# Patient Record
Sex: Female | Born: 1935 | Race: Black or African American | Hispanic: Refuse to answer
Health system: Midwestern US, Community
[De-identification: ages and names within clinical notes are randomized; demographics above are authoritative.]

## PROBLEM LIST (undated history)

## (undated) DIAGNOSIS — W19XXXA Unspecified fall, initial encounter: Secondary | ICD-10-CM

## (undated) DIAGNOSIS — Z111 Encounter for screening for respiratory tuberculosis: Secondary | ICD-10-CM

## (undated) DIAGNOSIS — K08109 Complete loss of teeth, unspecified cause, unspecified class: Secondary | ICD-10-CM

## (undated) DIAGNOSIS — F0390 Unspecified dementia without behavioral disturbance: Secondary | ICD-10-CM

## (undated) DIAGNOSIS — D890 Polyclonal hypergammaglobulinemia: Secondary | ICD-10-CM

## (undated) DIAGNOSIS — I10 Essential (primary) hypertension: Secondary | ICD-10-CM

## (undated) DIAGNOSIS — Z972 Presence of dental prosthetic device (complete) (partial): Secondary | ICD-10-CM

## (undated) DIAGNOSIS — R32 Unspecified urinary incontinence: Secondary | ICD-10-CM

## (undated) DIAGNOSIS — I639 Cerebral infarction, unspecified: Secondary | ICD-10-CM

## (undated) DIAGNOSIS — E785 Hyperlipidemia, unspecified: Secondary | ICD-10-CM

## (undated) HISTORY — PX: NO PAST SURGERIES: SHX2092

## (undated) HISTORY — DX: Hyperlipidemia, unspecified: E78.5

## (undated) HISTORY — DX: Complete loss of teeth, unspecified cause, unspecified class: K08.109

## (undated) HISTORY — DX: Presence of dental prosthetic device (complete) (partial): Z97.2

## (undated) HISTORY — DX: Unspecified dementia, unspecified severity, without behavioral disturbance, psychotic disturbance, mood disturbance, and anxiety: F03.90

## (undated) HISTORY — DX: Unspecified urinary incontinence: R32

## (undated) HISTORY — DX: Essential (primary) hypertension: I10

## (undated) HISTORY — DX: Cerebral infarction, unspecified: I63.9

## (undated) HISTORY — DX: Polyclonal hypergammaglobulinemia: D89.0

## (undated) HISTORY — PX: ABDOMINAL HYSTERECTOMY: SHX81

---

## 1986-10-08 DIAGNOSIS — I639 Cerebral infarction, unspecified: Secondary | ICD-10-CM

## 1986-10-08 HISTORY — DX: Cerebral infarction, unspecified: I63.9

## 2004-10-11 NOTE — ED Provider Notes (Signed)
[facility]                      EMERGENCY DEPARTMENT TREATMENT REPORT   ADMISSION   NAME:  Juanito Doom             PT. LOCATION:     ER  ERT2   MR #:         BILLING #: [id number]          DOS: 10/11/2004   TIME: 1:40 P   [birth year]   cc:   Primary Physician:   Dr. Trevor Iha   Time of Evaluation:  11:06 a.m.   CHIEF COMPLAINT:   Chills.   HISTORY OF PRESENT ILLNESS:  This 71 year old female was getting her hair   done at the beauty parlor this morning.  She had not been ill.  She had a   sudden onset of rigors.  She seemed to be somewhat confused.  Her speech   was slurred.  EMS was summoned and she was then brought to the emergency   department for evaluation.  At this time, complains of just being weak.   REVIEW OF SYSTEMS:   CONSTITUTIONAL:   Positive chills.   EYES: No visual symptoms.   ENT: No sore throat, runny nose or other URI symptoms.   ALLERGIC/IMMUNOLOGIC:  No urticaria or allergy symptoms.   RESPIRATORY:  No cough, shortness of breath, or wheezing.   CARDIOVASCULAR:  No chest pain, chest pressure, or palpitations.   GASTROINTESTINAL:  No vomiting, diarrhea, or abdominal pain.   GENITOURINARY:  The patient has been having some incontinence.  No dysuria.   MUSCULOSKELETAL:  No joint pain or swelling.   INTEGUMENTARY:  No rashes.   NEUROLOGICAL:   No headache.   PAST MEDICAL HISTORY:   Hypertension, hemorrhagic stroke.   FAMILY HISTORY:  Noncontributory.   SOCIAL HISTORY:  Here with family.   ALLERGIES:  None.   MEDICATIONS:   Atenolol, Klor-Con, Verapamil, Diovan.   PHYSICAL EXAMINATION:   GENERAL:   Very pleasant female.   VITAL SIGNS:  Blood pressure 167/84, pulse 96, respirations 24, temperature   100.9.  O2 saturation on room air is 94%.   HEENT:  Ears/Nose:  Hearing is grossly intact to voice.  Internal and   external examinations of the ears and nose are unremarkable.   Mouth/Throat:  Surfaces of the pharynx, palate, and tongue are pink, moist,    and without lesions.   NECK:  Supple, nontender, symmetrical, no masses or JVD, trachea midline,   thyroid not enlarged, nodular, or tender.   LYMPHATICS:  No cervical or submandibular lymphadenopathy palpated.   LUNGS:  There is some rhonchi right side.   GI:  Abdomen soft, nontender, without complaint of pain to palpation.  No   hepatomegaly or splenomegaly.   NEUROLOGICAL:   She is awake and alert, does move all extremities.   Strength is equal.  No focal deficits.   CONTINUATION BY DR. Frances Furbish:   I evaluated the patient with Mr. Malachi Paradise.  I interviewed and examined   the lady who is a 71 year old female who presents with altered mental   status and a fever, onset abruptly this morning.   Really no prodromal   symptoms in the emergency department.  We have pancultured her and treated   her fever and her mental status has improved.   IMPRESSION/MANAGEMENT PLAN:  Acute febrile illness, rule out sepsis.   DIAGNOSTIC TESTING:  A chest  x-ray per the radiologist showed mild   cardiomegaly without evidence of congestive heart failure.  CMP was totally   normal.  CBC showed a white cell count of 1400, hemoglobin 12.7, hematocrit   37.7, platelets are 126.  Differential is pending.  Urinalysis was nitrite   positive with pyuria and bacturia.   COURSE IN THE EMERGENCY DEPARTMENT:  As above, the patient was pancultured.   Tylenol was administered.   _____ sensorium improved.  Once the UTI was   identified, she was given Levaquin.  She improved.  I discussed the patient   with Dr. Celine Mans who is on for service medicine   today.  He is going to admit her to the hospital.  I am going to write some   courtesy admission orders.   Condition at this time is improved.   FINAL DIAGNOSIS:   Sepsis secondary to urinary tract infection.   Electronically Signed By:   Stormy Card, M.D. 10/13/2004 14:40   ____________________________   Stormy Card, M.D.   zga/zga  D:  10/11/2004  T:  10/11/2004  1:56 P   [id number]    Blanchard Mane, PA

## 2004-10-12 NOTE — Consults (Signed)
[facility]                               CONSULTATION REPORT                        CONSULTANT:  Antionette Poles, M.D.   NAME:       Michele Osborne   BILLING #:  [id number]             DATE OF CONSULT:     10/12/2004   MR #:       [id number]              ADM DATE:            10/11/2004   SS #        [id number]           PT. LOCATION:        [id number]   ATTENDING:  Georgeanna Harrison, M.D.   cc:    Georgeanna Harrison, M.D.          Antionette Poles, M.D.          Randell Loop, M.D.   REASON FOR CONSULTATION:   Evaluation of leukopenia.   CHIEF COMPLAINT:  Chills on the day of admission.   HISTORY OF PRESENT ILLNESS: Michele Osborne is a 71 year old pleasant   cooperative African-American female admitted to the emergency room with   acute onset of chills.  When this happened she was getting her hair done at   a beauty parlor.  She also noticed sudden onset of rigors.  She was also   found to be confused.  Her speech was slurred.  EMS was called and she was   evaluated by EMS and then brought into the emergency room for further   management of sudden onset rigors, chills, confusion, and weakness.   PAST MEDICAL/SURGICAL HISTORY:  The patient has a history of hypertension.   The patient had a stroke in 1978.  The patient also underwent hysterectomy   in the past.   PERSONAL HISTORY:  No smoking, no alcohol.  Divorced from her husband,   lives alone.  She has 3 grown up children.  Her mother is still alive, more   than 47 years old.   FAMILY HISTORY: No family history of any blood disorder.   SOCIAL HISTORY:  As mentioned above she is living by herself.   ALLERGIES:  No history of any allergy to any medications.   MEDICATIONS ON THE DAY OF ADMISSION:  The patient was taking Diovan,   verapamil, potassium supplements and atenolol.   REVIEW OF SYSTEMS:   CONSTITUTIONAL: Positive for chills.   EYES:  No visual disturbances.   ENT:  No sore throat.  No runny nose.    ALLERGY:  No rash or urticaria.   RESPIRATORY:  No shortness of breath.  No cough, hemoptysis or wheezing.   CARDIOVASCULAR:  No chest pain.  No tachycardia.  No palpitations.   GASTROINTESTINAL:  No abdominal symptoms like diarrhea, constipation or   pain.   GENITOURINARY:  The patient was having some incontinence temporarily.  No   history of dysuria.  No history of any blood in the urine.   MUSCULOSKELETAL:  No history of any muscle ache, swelling, or joint   swelling.   INTEGUMENTARY:  No skin rash.   NEUROLOGIC: No headaches.  No syncope but  feeling weak.   PHYSICAL FINDINGS ON ADMISSION:   VITAL SIGNS:  Blood pressure within normal limits, pulse normal,   temperature 100.9  at the emergency room .  Pulse oxygen showed 94%.   GENERAL:  Nutrition normal.   SKIN:  No nodule or petechiae.  No lymphadenopathy. No hepatosplenomegaly.   CHEST:  Heart sounds, lung sounds audible.   BREASTS:  No lumps.   EXTREMITIES: Homans sign negative.   CNS:  Examination normal.   PERTINENT HEMATOLOGIC LAB DATA:  The patient has normal chemistry except   albumin 3.2.  Renal profile and kidney profile normal.  Liver profile   normal.  WBC 1.4, with segmented neutrophils 69%, lymphocytes 30%,   eosinophils 1%.   Hemoglobin 12.7, hematocrit 37.7, indices normal except   RDW high at 16.1 and platelet count low at 126,000.  Urinalysis shows   bacteria.   HEMATOLOGY DISCUSSION:  This elderly lady admitted with signs and symptoms   of urosepsis presently with chills, rigors, and confusion has severe   neutropenia and moderate thrombocytopenia with a normal RBC count.   Most likely this pancytopenia that is leukopenia and thrombocytopenia most   likely due to bone marrow myeloid suppression from sepsis.  Other causes in   this age group may be underlying bone marrow disease like myelodysplastic   syndrome.   PLAN:  My plan is to;      1. Review the peripheral blood smear taken daily.      2. CBC.       3. Continue course of antibiotic to correct sepsis.  If there is no      marked improvement in her WBC count with antibiotic therapy consider a      bone marrow study at a later date.   Thank you again very much for asking me to see Ms. Turlington for evaluation   of leukopenia and thrombocytopenia.   Electronically Signed By:   Antionette Poles, M.D. 10/17/2004 13:01   _________________________________   Antionette Poles, M.D.   ndt  D:  10/12/2004  T:  10/12/2004  2:44 P   [id number]

## 2004-10-12 NOTE — H&P (Signed)
[facility]                              HISTORY AND PHYSICAL                             Georgeanna Harrison, M.D.   NAME:    Michele Osborne, Michele Osborne   MR #:    [id number]                    ADM DATE:        10/11/2004   BILLING  [id number]                   PT. LOCATION     [id number]   #:   SS #     [id number]   Georgeanna Harrison, M.D.   cc:    Georgeanna Harrison, M.D.   HISTORY OF PRESENT ILLNESS/HOSPITALIZATION:  [age]-year-old   African-American female was admitted to the emergency room from the beauty   parlor shop, with history of sudden onset of rigors, confused.  The patient   does not remember why she came here but she remembers that she was suddenly   having chills and rigors and then she was brought here.  In the emergency   room she was diagnosed as having urinary tract infection, possible sepsis,   and she was admitted for further management.   PAST MEDICAL HISTORY:  The patient denies history of diabetes, history of   hypertension, and history of possible hemorrhagic stroke or acute old   cerebrovascular accident in 17.   FAMILY HISTORY:  Unremarkable.   SOCIAL HISTORY:  No smoking, no alcohol.   ALLERGIES:  No known drug allergies.   REVIEW OF SYSTEMS:   CONSTITUTIONAL:  Positive for chills, no fever, but history of rigors.   EYES:  No history of any visual symptoms.   ENT:  No history of sore throat, runny nose or upper respiratory infection   symptoms.   ALLERGIC/IMMUNOLOGIC:  No urticarial rash.   RESPIRATORY:  No history of cough, expectoration or wheezing.   CARDIOVASCULAR:  No chest pain, no chest pressure or palpitation.   GASTROINTESTINAL:  No history of nausea, vomiting, diarrhea.   GENITOURINARY:  The patient herself denies any dysuria or polyuria but   complains about being somewhat incontinent.   PHYSICAL EXAMINATION:  On examination the patient is alert, awake,   oriented, quite pleasant female.    VITAL SIGNS:  Blood pressure 167/84, pulse 96 per minute, respirations 24   per minute, temperature 100.9, O2 saturations 94% at room air.   HEAD:  Atraumatic, normocephalic.  Eyes; Pupils equal, round, reactive to   light and accommodation bilaterally.   NECK:  No jugular venous distention, no thyromegaly.   CHEST:  Normal shape, bilateral air entry present, no rales, no rhonchi.   CARDIOVASCULAR SYSTEM:  S1, S2 regular.   ABDOMEN:  Benign, soft, bowel sounds present, no guarding, no rigidity.   Stool rectal negative for occult blood.   EXTREMITIES:  No edema.   LABORATORY:  Sodium 139, potassium 3.8, chloride 107, bicarbonate 24,   glucose 104, BUN 16, creatinine 0.8, SGOT 40, SGPT 43, alkaline phosphatase   125, calcium 8.6, total protein 7.9, albumin 3.2, WBC 1.4, hemoglobin 12.7,   hematocrit 37.7.  Urine cloudy with very positive infection, 3+ bacteria.   Gram  negative colonies in the urine.   IMPRESSION:      1. Sepsis.      2. Leukopenia.      3. Urinary tract infection.      4. Rigors.      5. Hypertension.      6. History of old cerebrovascular accident.   PLAN:      1. Rigors most likely secondary to urinary tract infection colony      counted only 20,000 at this point but will treat her with Levaquin,      continue follow up on her culture and sensitivity results.      2. Hypertension, would continue her medications and follow up the blood      pressure.      3. Leukopenia, I would repeat CBC and follow up on leukopenia.  The      cause of leukopenia could be sepsis overwhelming or some other      pathological condition underlying.  Medication wise I do not see any      medication which is responsible for her leukopenia at this point.      4. Will consult hematologic consultation for the same.   Electronically Signed By:   Georgeanna Harrison, M.D. 10/13/2004 09:45   ____________________________   Georgeanna Harrison, M.D.   [email or web address]  D:  10/12/2004  T:  10/12/2004  9:31 A   [id number]

## 2004-10-15 NOTE — Discharge Summary (Signed)
[facility]                                DISCHARGE SUMMARY   Michele Osborne, EIMER   E:   MR  [id number]                          ADM DATE:      10/11/2004   #:   Michele Osborne  [id number]                       DIS DATE:      10/15/2004   #   Georgeanna Harrison, M.D.   cc:    Georgeanna Harrison, M.D.   HISTORY OF PRESENT ILLNESS:  The patient is a 71 year old African-American   female with the past medical history of hypertension, admitted with a   history of sudden onset of fever, chills and shakes and also, altered   mental status without any focal deficit.   HOSPITAL COURSE:  The patient was admitted.  Her urine showed a urinary   tract infection E. coli, sensitive to Levaquin.  The patient was started on   Levaquin.  Now, today she is afebrile.   During her stay she was also found that she has leukopenia and WBC count   was 1.7.  Dr. Glean Hess, a Hematologic consultation was called in.  Further   testing did not show any hepatosplenomegaly or any pathology suggestive of   primary cause of leukopenia, but her leukopenia improved after IV   antibiotics, suggestive of sepsis related leukopenia.   DISCHARGE DIAGNOSES:   1.     Urinary tract sepsis.   2.     Urinary tract infection.   3.     Leukopenia.   4.     Hypertension.   DISCHARGE AND DISCHARGE INSTRUCTIONS:  The patient is being discharged   home.  The patient will be followed up with her primary care doctor in one   week.   DIET:  Low salt, low cholesterol diet.   ACTIVITY:  As tolerated.   DISCHARGE MEDICATIONS:   1.     She will continue her medications, which includes Atenolol 35 mg      p.o. once a day.   2.     Diovan 160/12.5 mg p.o. once a day.   3.     Verapamil 240 mg p.o. once a day.   4.     Levaquin 250 mg p.o. once a day for the next seven days.   Electronically Signed By:   Georgeanna Harrison, M.D. 10/17/2004 08:20   ________________________________   Georgeanna Harrison, M.D.    [email or web address]  D:  10/15/2004  T:  10/16/2004 11:33 A   [id number]

## 2008-10-21 LAB — METABOLIC PANEL, COMPREHENSIVE
A-G Ratio: 0.7 — ABNORMAL LOW (ref 0.8–1.7)
ALT (SGPT): 29 U/L — ABNORMAL LOW (ref 30–65)
AST (SGOT): 24 U/L (ref 15–37)
Albumin: 3.7 g/dL (ref 3.4–5.0)
Alk. phosphatase: 99 U/L (ref 50–136)
Anion gap: 7 mmol/L (ref 5–15)
BUN/Creatinine ratio: 19 (ref 12–20)
BUN: 15 MG/DL (ref 7–18)
Bilirubin, total: 0.3 MG/DL (ref 0.1–0.9)
CO2: 27 MMOL/L (ref 21–32)
Calcium: 9.1 MG/DL (ref 8.4–10.4)
Chloride: 104 MMOL/L (ref 100–108)
Creatinine: 0.8 MG/DL (ref 0.6–1.3)
GFR est AA: >60 mL/min/{1.73_m2} (ref >60)
GFR est non-AA: >60 mL/min/{1.73_m2} (ref >60)
Globulin: 5.3 g/dL — ABNORMAL HIGH (ref 2.0–4.0)
Glucose: 102 MG/DL — ABNORMAL HIGH (ref 74–99)
Potassium: 3.7 MMOL/L (ref 3.5–5.5)
Protein, total: 9 g/dL — ABNORMAL HIGH (ref 6.4–8.2)
Sodium: 138 MMOL/L (ref 136–145)

## 2008-10-23 LAB — PROTEIN ELECTROPHORESIS
Albumin: 3.53 g/dL — ABNORMAL LOW (ref 3.75–5.01)
Alpha-1: 0.35 g/dL (ref 0.19–0.46)
Alpha-2: 0.96 g/dL (ref 0.48–1.05)
Beta: 1.35 g/dL — ABNORMAL HIGH (ref 0.48–1.10)
Gamma: 2.4 g/dL — ABNORMAL HIGH (ref 0.62–1.51)
Total protein: 8.6 g/dL — ABNORMAL HIGH (ref 6.00–8.30)

## 2008-10-23 LAB — PROTEIN ELECTROPHORESIS + IFE, UR, 24HR
Albumin,urine: DETECTED
Alpha-1 Globulins,urine: DETECTED
Alpha-2 Globulins,urine: DETECTED
Beta globulins,urine: DETECTED
FREE K/L RATIO: 5.2 ratio (ref 2.04–10.37)
FREE KAPPA LT CHAINS: 8.32 mg/dL — ABNORMAL HIGH (ref 0.14–2.42)
FREE LAMBDA LT CHAINS: 1.6 mg/dL — ABNORMAL HIGH (ref 0.02–0.67)
Gamma globulins,urine: DETECTED

## 2010-03-10 LAB — HM DEXA SCAN

## 2014-03-22 LAB — HM MAMMOGRAPHY

## 2014-06-10 ENCOUNTER — Ambulatory Visit (INDEPENDENT_AMBULATORY_CARE_PROVIDER_SITE_OTHER): Payer: Medicare Other | Admitting: Medical

## 2014-06-10 ENCOUNTER — Telehealth: Payer: Self-pay | Admitting: Medical

## 2014-06-10 ENCOUNTER — Encounter: Payer: Self-pay | Admitting: Medical

## 2014-06-10 VITALS — BP 130/80 | HR 100 | Temp 98.5°F | Resp 16 | Ht 60.0 in | Wt 108.0 lb

## 2014-06-10 DIAGNOSIS — F03918 Unspecified dementia, unspecified severity, with other behavioral disturbance: Secondary | ICD-10-CM

## 2014-06-10 DIAGNOSIS — Z87440 Personal history of urinary (tract) infections: Secondary | ICD-10-CM

## 2014-06-10 DIAGNOSIS — L608 Other nail disorders: Secondary | ICD-10-CM

## 2014-06-10 DIAGNOSIS — R69 Illness, unspecified: Secondary | ICD-10-CM

## 2014-06-10 DIAGNOSIS — I10 Essential (primary) hypertension: Secondary | ICD-10-CM

## 2014-06-10 DIAGNOSIS — Z8673 Personal history of transient ischemic attack (TIA), and cerebral infarction without residual deficits: Secondary | ICD-10-CM

## 2014-06-10 DIAGNOSIS — L602 Onychogryphosis: Secondary | ICD-10-CM

## 2014-06-10 DIAGNOSIS — Z9181 History of falling: Secondary | ICD-10-CM

## 2014-06-10 DIAGNOSIS — E785 Hyperlipidemia, unspecified: Secondary | ICD-10-CM

## 2014-06-10 DIAGNOSIS — Z7409 Other reduced mobility: Secondary | ICD-10-CM

## 2014-06-10 DIAGNOSIS — N3946 Mixed incontinence: Secondary | ICD-10-CM

## 2014-06-10 DIAGNOSIS — Q849 Congenital malformation of integument, unspecified: Secondary | ICD-10-CM

## 2014-06-10 DIAGNOSIS — F0391 Unspecified dementia with behavioral disturbance: Secondary | ICD-10-CM

## 2014-06-10 DIAGNOSIS — Q846 Other congenital malformations of nails: Secondary | ICD-10-CM

## 2014-06-10 LAB — COMPREHENSIVE METABOLIC PANEL
ALBUMIN: 3.8 g/dL (ref 3.5–5.2)
ALK PHOS: 76 U/L (ref 39–117)
ALT: 9 U/L (ref 0–35)
AST: 17 U/L (ref 0–37)
BUN: 20 mg/dL (ref 6–23)
CO2: 27 meq/L (ref 19–32)
Calcium: 9.6 mg/dL (ref 8.4–10.5)
Chloride: 105 mEq/L (ref 96–112)
Creat: 0.92 mg/dL (ref 0.50–1.10)
GLUCOSE: 92 mg/dL (ref 70–99)
Potassium: 3.7 mEq/L (ref 3.5–5.3)
SODIUM: 141 meq/L (ref 135–145)
TOTAL PROTEIN: 8.8 g/dL — AB (ref 6.0–8.3)
Total Bilirubin: 0.3 mg/dL (ref 0.2–1.2)

## 2014-06-10 LAB — POCT URINALYSIS DIPSTICK
Bilirubin, UA: NEGATIVE
Glucose, UA: NEGATIVE
Ketones, UA: NEGATIVE
Nitrite, UA: POSITIVE
RBC UA: POSITIVE
SPEC GRAV UA: 1.01
UROBILINOGEN UA: NEGATIVE
pH, UA: 5

## 2014-06-10 LAB — CBC
HCT: 37 % (ref 36.0–46.0)
Hemoglobin: 12.6 g/dL (ref 12.0–15.0)
MCH: 27.8 pg (ref 26.0–34.0)
MCHC: 34.1 g/dL (ref 30.0–36.0)
MCV: 81.5 fL (ref 78.0–100.0)
Platelets: 181 10*3/uL (ref 150–400)
RBC: 4.54 MIL/uL (ref 3.87–5.11)
RDW: 17.1 % — ABNORMAL HIGH (ref 11.5–15.5)
WBC: 6.3 10*3/uL (ref 4.0–10.5)

## 2014-06-10 LAB — TSH: TSH: 3.992 u[IU]/mL (ref 0.350–4.500)

## 2014-06-10 NOTE — Telephone Encounter (Signed)
pls refer to [facility] for dementia, and call out a walker to medical supply or home health.

## 2014-06-10 NOTE — Progress Notes (Signed)
**Note Kathleen-Identified via Obfuscation** Subjective:   Kathleen Chung is a 80 y.o. female presenting on 06/10/2014 with to get established and have forms filled out  Here as a new patient today.   Here with both her daughters, Kathleen Chung and Kathleen Chung (patient of mine)  She was living alone in [place] [place] had her own apartment.  But she was having worsening problems managing her day to day functions.  Just moved in with daughter here in [place] yesterday.     Daughter moved her in to take better care of her, keep better watch on her.  Her dementia has worsened more in last few months.   Having to hold onto things to avoid falling.  Having mood swings.  Waking up in the morning, she doesn't know where she is at times.  Not eating 3 meals daily, sometimes has to be prompted to eat.  daughter wants to use in house nurse, [facility] for adult day care, and daughter's own care for day to day functioning.   She has to be prompted to go to the toilet.  Wears pads.  Has hx/o frequent UTI.   daughter is bathing her.  She does not cook her own foods.  Was seeing neurology back in [place].   She had massive stroke back in the 33s.  Hasn't been seeing psychiatry.  daughter has forms for [facility] medical form as well as FMLA for her to help take care of mother.  Daughter has concerns about her feet.  Left ring fingernail looks like it fell off.   She is on medication for hypertension, hyperlipidemia, an overactive bladder.  Her last labs in primary care visit was January of this year back in [place].     No other complaint.  Review of Systems ROS as in subjective      Objective:   Filed Vitals:   06/10/14 1021  BP: 130/80  Pulse: 100  Temp: 98.5 F (36.9 C)  Resp: 16   BP 130/80  Pulse 100  Temp(Src) 98.5 F (36.9 C) (Oral)  Resp 16  Ht 5' (1.524 m)  Wt 108 lb (48.988 kg)  BMI 21.09 kg/m2   General appearance: alert, no distress, WD/WN, pleasant lean AA female HEENT:  normocephalic, sclerae anicteric, TMs pearly, nares patent, no discharge or erythema, pharynx normal Oral cavity: MMM, no lesions, full dentures present Neck: supple, no lymphadenopathy, no thyromegaly, no masses Heart: RRR, normal S1, S2, no murmurs Lungs: CTA bilaterally, no wheezes, rhonchi, or rales Abdomen: +bs, soft, non tender, non distended, no masses, no hepatomegaly, no splenomegaly Pulses: 2+ symmetric, upper and lower extremities, normal cap refill Ext: no edema, no cyanosis or clubbing Skin: dry skin of feet, thickened toenails throughout, bunions present bilat great toes, left ring finger/4th finger nail with deformity from likley crush injury in the past Neuro: Cn2-12 intact, DTRs normal, normal strength and sensation, but she was talking about past events/past people in a present tense in general conversation suggesting dementia.  Pleasant, does answer my questions, but seems to downplay any concerns.  MMSE:17/30 score today       Assessment: Encounter Diagnoses  Name Primary?  . Dementia, with behavioral disturbance Yes  . History of stroke   . Essential hypertension, benign   . Hyperlipidemia   . Mixed incontinence   . Thickened nails   . Nail anomaly   . Impaired mobility and ADLs   . Risk for falls   . History of urinary tract infection  Plan: Dementia, history of stroke-MMSE today with score of 17 for baseline.  PHQ-9 score today of 16.   Mood disorder questionnaire negative.  Completed form for adult day care, completed FMLA form for daughter.  We will request prior records, referral to neurology here.  Hypertension-continue current medication, labs today  Hyperlipidemia-daughter's request that she not take this medication if not needed. We will defer this to next visit  Mixed incontinence, history of urinary tract infection-urinalysis today abnormal.  Urine culture sent.  Nail anomaly - we can pursue podiatry referral at a later visit  Impaired  mobility and ADLs, risk for falls - discussed the need for continuous supervision which it sounds like her daughter is working on.  Daughter will be taking care of her, she will pursue adult day care for activities during the day with supervision, and a home health nurse will be coming in to help in the home as well. She certainly has advanced dementia to the point she does not need to be trusted alone, with cooking/stove, with driving or managing her financial affairs.  Spent approximately 1 hour in face-to-face evaluation, reviewing records, completing forms, setting at referrals, and coming up with plan of care.  Handout given and discussed.Britta Mccreedy was seen today for to get established and have forms filled out.  Diagnoses and associated orders for this visit:  Dementia, with behavioral disturbance - Comprehensive metabolic panel - CBC - TSH - RPR  History of stroke  Essential hypertension, benign - Comprehensive metabolic panel - CBC - TSH  Hyperlipidemia  Mixed incontinence - POCT urinalysis dipstick  Thickened nails  Nail anomaly  Impaired mobility and ADLs  Risk for falls  History of urinary tract infection     Return pending labs.

## 2014-06-10 NOTE — Addendum Note (Signed)
Addended by: Jac Canavan on: 06/10/2014 09:14 PM   Modules accepted: Level of Service

## 2014-06-10 NOTE — Patient Instructions (Signed)
Thank you for giving me the opportunity to serve you today.    Your diagnosis today includes: Encounter Diagnoses  Name Primary?  . Dementia, with behavioral disturbance Yes  . History of stroke   . Essential hypertension, benign   . Hyperlipidemia   . Mixed incontinence   . Thickened nails   . Nail anomaly   . Impaired mobility and ADLs   . Risk for falls   . History of urinary tract infection      Specific recommendations today include:  I completed the adult day care form today.  We will refer her to neurology here to [facility] care  We will hold off on podiatry referral for now unless you want to call and get her appoitnement in general  Please establish her with and eye doctor  We will call with lab results  I do recommend the things you discussed such as 100% supervision either by daughter, home health nurse during the day or adult day care  Given her dementia, she does not seem to be able to be trusted with cooking or using stoves or space heaters on her own, she does need help with bathing, activities of daily living, she should not be driving, she should not be handling her financial affairs.  I do think it is a good at it to have adult day care enrichment activities for her  She is a fall risk and make sure the home is safe and without things that can cause her to fall such as loose rugs, loose carpets, clutter  I will work on the other FMLA and return this   Return pending labs.    I have included other useful information below for your review.   Fall Prevention and Home Safety Falls cause injuries and can affect all age groups. It is possible to use preventive measures to significantly decrease the likelihood of falls. There are many simple measures which can make your home safer and prevent falls. OUTDOORS  Repair cracks and edges of walkways and driveways.  Remove high doorway thresholds.  Trim shrubbery on the main path into your home.  Have  good outside lighting.  Clear walkways of tools, rocks, debris, and clutter.  Check that handrails are not broken and are securely fastened. Both sides of steps should have handrails.  Have leaves, snow, and ice cleared regularly.  Use sand or salt on walkways during winter months.  In the garage, clean up grease or oil spills. BATHROOM  Install night lights.  Install grab bars by the toilet and in the tub and shower.  Use non-skid mats or decals in the tub or shower.  Place a plastic non-slip stool in the shower to sit on, if needed.  Keep floors dry and clean up all water on the floor immediately.  Remove soap buildup in the tub or shower on a regular basis.  Secure bath mats with non-slip, double-sided rug tape.  Remove throw rugs and tripping hazards from the floors. BEDROOMS  Install night lights.  Make sure a bedside light is easy to reach.  Do not use oversized bedding.  Keep a telephone by your bedside.  Have a firm chair with side arms to use for getting dressed.  Remove throw rugs and tripping hazards from the floor. KITCHEN  Keep handles on pots and pans turned toward the center of the stove. Use back burners when possible.  Clean up spills quickly and allow time for drying.  Avoid walking on wet  floors.  Avoid hot utensils and knives.  Position shelves so they are not too high or low.  Place commonly used objects within easy reach.  If necessary, use a sturdy step stool with a grab bar when reaching.  Keep electrical cables out of the way.  Do not use floor polish or wax that makes floors slippery. If you must use wax, use non-skid floor wax.  Remove throw rugs and tripping hazards from the floor. STAIRWAYS  Never leave objects on stairs.  Place handrails on both sides of stairways and use them. Fix any loose handrails. Make sure handrails on both sides of the stairways are as long as the stairs.  Check carpeting to make sure it is  firmly attached along stairs. Make repairs to worn or loose carpet promptly.  Avoid placing throw rugs at the top or bottom of stairways, or properly secure the rug with carpet tape to prevent slippage. Get rid of throw rugs, if possible.  Have an electrician put in a light switch at the top and bottom of the stairs. OTHER FALL PREVENTION TIPS  Wear low-heel or rubber-soled shoes that are supportive and fit well. Wear closed toe shoes.  When using a stepladder, make sure it is fully opened and both spreaders are firmly locked. Do not climb a closed stepladder.  Add color or contrast paint or tape to grab bars and handrails in your home. Place contrasting color strips on first and last steps.  Learn and use mobility aids as needed. Install an electrical emergency response system.  Turn on lights to avoid dark areas. Replace light bulbs that burn out immediately. Get light switches that glow.  Arrange furniture to create clear pathways. Keep furniture in the same place.  Firmly attach carpet with non-skid or double-sided tape.  Eliminate uneven floor surfaces.  Select a carpet pattern that does not visually hide the edge of steps.  Be aware of all pets. OTHER HOME SAFETY TIPS  Set the water temperature for 120 F (48.8 C).  Keep emergency numbers on or near the telephone.  Keep smoke detectors on every level of the home and near sleeping areas. Document Released: 09/14/2002 Document Revised: 03/25/2012 Document Reviewed: 12/14/2011 [facility] Patient Information 2015 [place], [place]. This information is not intended to replace advice given to you by your health care provider. Make sure you discuss any questions you have with your health care provider.   Dementia Dementia is a general term for problems with brain function. A person with dementia has memory loss and a hard time with at least one other brain function such as thinking, speaking, or problem solving. Dementia can affect  social functioning, how you do your job, your mood, or your personality. The changes may be hidden for a long time. The earliest forms of this disease are usually not detected by family or friends. Dementia can be:  Irreversible.  Potentially reversible.  Partially reversible.  Progressive. This means it can get worse over time. CAUSES  Irreversible dementia causes may include:  Degeneration of brain cells (Alzheimer disease or Lewy body dementia).  Multiple small strokes (vascular dementia).  Infection (chronic meningitis or Creutzfeldt-Jakob disease).  Frontotemporal dementia. This affects younger people, age 60 to 45, compared to those who have Alzheimer disease.  Dementia associated with other disorders like Parkinson disease, Huntington disease, or HIV-associated dementia. Potentially or partially reversible dementia causes may include:  Medicines.  Metabolic causes such as excessive alcohol intake, vitamin B12 deficiency, or thyroid disease.  Masses or pressure in the brain such as a tumor, blood clot, or hydrocephalus. SIGNS AND SYMPTOMS  Symptoms are often hard to detect. Family members or coworkers may not notice them early in the disease process. Different people with dementia may have different symptoms. Symptoms can include:  A hard time with memory, especially recent memory. Long-term memory may not be impaired.  Asking the same question multiple times or forgetting something someone just said.  A hard time speaking your thoughts or finding certain words.  A hard time solving problems or performing familiar tasks (such as how to use a telephone).  Sudden changes in mood.  Changes in personality, especially increasing moodiness or mistrust.  Depression.  A hard time understanding complex ideas that were never a problem in the past. DIAGNOSIS  There are no specific tests for dementia.   Your health care provider may recommend a thorough evaluation. This is  because some forms of dementia can be reversible. The evaluation will likely include a physical exam and getting a detailed history from you and a family member. The history often gives the best clues and suggestions for a diagnosis.  Memory testing may be done. A detailed brain function evaluation called neuropsychologic testing may be helpful.  Lab tests and brain imaging (such as a CT scan or MRI scan) are sometimes important.  Sometimes observation and re-evaluation over time is very helpful. TREATMENT  Treatment depends on the cause.   If the problem is a vitamin deficiency, it may be helped or cured with supplements.  For dementias such as Alzheimer disease, medicines are available to stabilize or slow the course of the disease. There are no cures for this type of dementia.  Your health care provider can help direct you to groups, organizations, and other health care providers to help with decisions in the care of you or your loved one. HOME CARE INSTRUCTIONS The care of individuals with dementia is varied and dependent upon the progression of the dementia. The following suggestions are intended for the person living with, or caring for, the person with dementia.  Create a safe environment.  Remove the locks on bathroom doors to prevent the person from accidentally locking himself or herself in.  Use childproof latches on kitchen cabinets and any place where cleaning supplies, chemicals, or alcohol are kept.  Use childproof covers in unused electrical outlets.  Install childproof devices to keep doors and windows secured.  Remove stove knobs or install safety knobs and an automatic shut-off on the stove.  Lower the temperature on water heaters.  Label medicines and keep them locked up.  Secure knives, lighters, matches, power tools, and guns, and keep these items out of reach.  Keep the house free from clutter. Remove rugs or anything that might contribute to a  fall.  Remove objects that might break and hurt the person.  Make sure lighting is good, both inside and outside.  Install grab rails as needed.  Use a monitoring device to alert you to falls or other needs for help.  Reduce confusion.  Keep familiar objects and people around.  Use night lights or dim lights at night.  Label items or areas.  Use reminders, notes, or directions for daily activities or tasks.  Keep a simple, consistent routine for waking, meals, bathing, dressing, and bedtime.  Create a calm, quiet environment.  Place large clocks and calendars prominently.  Display emergency numbers and home address near all telephones.  Use cues to establish different  times of the day. An example is to open curtains to let the natural light in during the day.   Use effective communication.  Choose simple words and short sentences.  Use a gentle, calm tone of voice.  Be careful not to interrupt.  If the person is struggling to find a word or communicate a thought, try to provide the word or thought.  Ask one question at a time. Allow the person ample time to answer questions. Repeat the question again if the person does not respond.  Reduce nighttime restlessness.  Provide a comfortable bed.  Have a consistent nighttime routine.  Ensure a regular walking or physical activity schedule. Involve the person in daily activities as much as possible.  Limit napping during the day.  Limit caffeine.  Attend social events that stimulate rather than overwhelm the senses.  Encourage good nutrition and hydration.  Reduce distractions during meal times and snacks.  Avoid foods that are too hot or too cold.  Monitor chewing and swallowing ability.  Continue with routine vision, hearing, dental, and medical screenings.  Give medicines only as directed by the health care provider.  Monitor driving abilities. Do not allow the person to drive when safe driving is no  longer possible.  Register with an identification program which could provide location assistance in the event of a missing person situation. SEEK MEDICAL CARE IF:   New behavioral problems start such as moodiness, aggressiveness, or seeing things that are not there (hallucinations).  Any new problem with brain function happens. This includes problems with balance, speech, or falling a lot.  Problems with swallowing develop.  Any symptoms of other illness happen. Small changes or worsening in any aspect of brain function can be a sign that the illness is getting worse. It can also be a sign of another medical illness such as infection. Seeing a health care provider right away is important. SEEK IMMEDIATE MEDICAL CARE IF:   A fever develops.  New or worsened confusion develops.  New or worsened sleepiness develops.  Staying awake becomes hard to do. Document Released: 03/20/2001 Document Revised: 02/08/2014 Document Reviewed: 02/19/2011 [facility] Patient Information 2015 [place], [place]. This information is not intended to replace advice given to you by your health care provider. Make sure you discuss any questions you have with your health care provider.

## 2014-06-11 ENCOUNTER — Other Ambulatory Visit: Payer: Self-pay | Admitting: Medical

## 2014-06-11 LAB — RPR

## 2014-06-11 MED ORDER — CEPHALEXIN 500 MG PO CAPS: 500.0000 mg | ORAL_CAPSULE | Freq: Three times a day (TID) | ORAL | Status: DC

## 2014-06-13 LAB — URINE CULTURE: Colony Count: >=100000

## 2014-06-15 ENCOUNTER — Telehealth: Payer: Self-pay | Admitting: Family Medicine

## 2014-06-15 NOTE — Telephone Encounter (Signed)
Unable to reach by phone

## 2014-06-15 NOTE — Telephone Encounter (Signed)
Message copied by Janeice Robinson on Tue Jun 15, 2014  2:35 PM ------      Message from: Jac Canavan      Created: Thu Jun 10, 2014  7:48 PM       Pls call back daughter De Blanch.  Regarding the FMLA, the most I can say on the form is patient may miss 8 hours per 1 day per month to help with mother's care.   She had noted 4 days per week.  I can't do anything other than 1 day per month on the FMLA.  She should talk with her HR at her employer regarding mom's condition, and if needed consider other arrangements or reduction in work hours if she is going to be that involved in mother's care.              Let me know if agreeable to putting 1 day per month on the form. ------

## 2014-06-15 NOTE — Telephone Encounter (Signed)
I spoke with Kathleen Chung and I went over your message in detail and she understood. She states that she will pick up those forms that you signed for being out 8 hours per 1 month. CLS

## 2014-06-15 NOTE — Telephone Encounter (Signed)
No answer. CLS

## 2014-06-16 NOTE — Telephone Encounter (Signed)
Form completed for daughter De Blanch.  Charge the usual fee.  Make copies and return FMLA to patient, make sure we scan this under the patient [facility] file.

## 2014-06-17 DIAGNOSIS — Z029 Encounter for administrative examinations, unspecified: Secondary | ICD-10-CM

## 2014-06-17 NOTE — Telephone Encounter (Signed)
Spoke with daughter about walker. Advised her we would call when we hear about appointment.

## 2014-06-17 NOTE — Telephone Encounter (Signed)
Sent fax to [facility] for appointment.

## 2014-06-17 NOTE — Telephone Encounter (Signed)
LM to [facility] 

## 2014-06-18 ENCOUNTER — Telehealth: Payer: Self-pay | Admitting: Medical

## 2014-06-18 NOTE — Telephone Encounter (Signed)
Additional records received from pt neuro doctor. Records sent to back for review.

## 2014-06-24 NOTE — Telephone Encounter (Signed)
Unable to contact patient by phone number provided. Spoke with daughter, Meriam Sprague and she doesn't know if her mom has seen neurologist or not. There is a note in the system that [place] received records from her Neuro doctor, and sent them back for review.

## 2014-06-24 NOTE — Telephone Encounter (Signed)
pls check on this, still open in my box

## 2014-06-24 NOTE — Telephone Encounter (Signed)
Records are ready to be scanned.  I have reviewed.  Why has it been so difficult to get in touch with her daughter>?  We referred to neuro, and I asked for 1 mo follow up.  She lives with one.  I did the FMLA for the other daughter.  Please make sure we get updated contact info on the daughters.  One of the daughters is my patient.

## 2014-06-25 ENCOUNTER — Telehealth: Payer: Self-pay | Admitting: Medical

## 2014-06-25 NOTE — Telephone Encounter (Signed)
Received records from pt's former primary care. Sending back for review.

## 2014-06-25 NOTE — Telephone Encounter (Signed)
Left message at home/Vicky who is the daughter who lives with our patient.  Also left message with emergency contact daughter.  Pt has appt Monday with [facility].  Need schedule follow up appt here 10/3

## 2014-06-28 ENCOUNTER — Encounter: Payer: Self-pay | Admitting: Diagnostic Neuroimaging

## 2014-06-28 ENCOUNTER — Ambulatory Visit (INDEPENDENT_AMBULATORY_CARE_PROVIDER_SITE_OTHER): Payer: Medicare Other | Admitting: Diagnostic Neuroimaging

## 2014-06-28 VITALS — BP 142/90 | HR 96 | Temp 97.8°F | Ht 60.0 in | Wt 107.6 lb

## 2014-06-28 DIAGNOSIS — F0390 Unspecified dementia without behavioral disturbance: Secondary | ICD-10-CM

## 2014-06-28 DIAGNOSIS — F03B0 Unspecified dementia, moderate, without behavioral disturbance, psychotic disturbance, mood disturbance, and anxiety: Secondary | ICD-10-CM | POA: Insufficient documentation

## 2014-06-28 NOTE — Progress Notes (Signed)
[facility]  PATIENT: Kathleen Chung DOB: 30-Mar-1930  REFERRING CLINICIAN: Tysinger HISTORY FROM: patient and daughter Kathleen JEWELL) REASON FOR VISIT: new consult   HISTORICAL  CHIEF COMPLAINT:  Chief Complaint  Patient presents with  . Dementia    HISTORY OF PRESENT ILLNESS:   80 year old right-handed female with hypertension, hypercholesterolemia, right MCA hemorrhagic stroke 1988, here for evaluation of dementia.  Patient had massive hemorrhagic stroke in 1988 with prolonged hospital stay. Patient had significant memory and language deficits at that time. Patient made partial recovery and continued to live independently until recently. However over the years patient has had progressive short-term memory loss. Symptoms much worse in the last 5-10 years. Patient was evaluated by neurology in [place], diagnosed with dementia, started on galantamine (could not tolerate due to fatigue) and then switch to Namenda. Patient progressively lost ability to take care of herself. Her home was disheveled, and she was not bathing, could not maintain personal hygiene. 2 weeks ago patient transitioned to move to [place] to live with her daughter. Patient's daughters have made arrangements with home health aide, adult enrichment center, to care for patient at home.   Patient has had some mood changes since transitioning to [place]. These are mild and transient. Overall patient is a good mood. She has had progressive balance and gait difficulty with short shuffling steps. No hallucinations. Short-term memory problems are quite significant.   REVIEW OF SYSTEMS: Full 14 system review of systems performed and notable only for wt loss fatigue cough incontinence feeling cold memory loss confusion change in appetite.   ALLERGIES: Allergies  Allergen Reactions  . Sulfa Antibiotics     rash    HOME MEDICATIONS: Outpatient Prescriptions Prior to Visit  Medication  Sig Dispense Refill  . amLODipine (NORVASC) 10 MG tablet Take 10 mg by mouth daily.      Marland Kitchen atorvastatin (LIPITOR) 10 MG tablet Take 10 mg by mouth daily.      . memantine (NAMENDA) 10 MG tablet Take 10 mg by mouth 2 (two) times daily.      Marland Kitchen tolterodine (DETROL LA) 4 MG 24 hr capsule Take 4 mg by mouth daily.      . cephALEXin (KEFLEX) 500 MG capsule Take 1 capsule (500 mg total) by mouth 3 (three) times daily.  30 capsule  0   No facility-administered medications prior to visit.    PAST MEDICAL HISTORY: Past Medical History  Diagnosis Date  . Stroke 1988    resulting cognition problems and dementia  . Dementia     was followed by Neurology in [place], [place] prior.  on Namenda since 2012  . Hypertension   . Full dentures   . Urinary incontinence     wears pads daily, but also uses toilet  . Hyperlipidemia     PAST SURGICAL HISTORY: Past Surgical History  Procedure Laterality Date  . No past surgeries      FAMILY HISTORY: Family History  Problem Relation Age of Onset  . Cancer Mother     SOCIAL HISTORY:  History   Social History  . Marital Status: Unknown    Spouse Name: N/A    Number of Children: 3  . Years of Education: College   Occupational History  . Retired Other   Social History Main Topics  . Smoking status: Former Games developer  . Smokeless tobacco: Never Used  . Alcohol Use: No  . Drug Use: No  . Sexual Activity: Not on file  Other Topics Concern  . Not on file   Social History Narrative   Lives with daughter here in [place] as of 05/2014.  Was living alone in [place], [place] prior to 05/2014.   [employer].     Caffeine Use: n/a     PHYSICAL EXAM  Filed Vitals:   06/28/14 0855  BP: 142/90  Pulse: 96  Temp: 97.8 F (36.6 C)  TempSrc: Oral  Height: 5' (1.524 m)  Weight: 107 lb 9.6 oz (48.807 kg)    Not recorded    Body mass index is 21.01 kg/(m^2).  GENERAL EXAM: Patient is in no distress; well developed, nourished and groomed; neck is  supple  CARDIOVASCULAR: Regular rate and rhythm, no murmurs, no carotid bruits  NEUROLOGIC: MENTAL STATUS: awake, alert, oriented to "2015 Fall FEB Saturday [place]", person, REGISTERS 3/3, RECALLS 2/3; CANNOT REPEAT, WRITE SENTENCE, COPY PENTAGONS OR FOLLOW DIRECTIONS. MMSE 11/30. NEG PALMOMENTAL. AFT 1. GDS 5.  CRANIAL NERVE: no papilledema on fundoscopic exam, pupils equal and reactive to light, visual fields full to confrontation, extraocular muscles intact, no nystagmus, facial sensation and strength symmetric, hearing intact, palate elevates symmetrically, uvula midline, shoulder shrug symmetric, tongue midline. MOTOR: MOTOR APRAXIA. Normal bulk and tone, full strength in the BUE, BLE; NO TREMOR, NO RIGIDITY; MOD BRADYKINESIA SENSORY: normal and symmetric to light touch; CANNOT COMPREHEND HOW TO FOLLOW SENSORY EXAM. COORDINATION: finger-nose-finger, fine finger movements normal REFLEXES: deep tendon reflexes TRACE and symmetric GAIT/STATION: SHORT, SHUFFLING STEPS, EN BLOC TURNING, UNSTEADY.     DIAGNOSTIC DATA (LABS, IMAGING, TESTING) - I reviewed patient records, labs, notes, testing and imaging myself where available.  Lab Results  Component Value Date   WBC 6.3 06/10/2014   HGB 12.6 06/10/2014   HCT 37.0 06/10/2014   MCV 81.5 06/10/2014   PLT 181 06/10/2014      Component Value Date/Time   NA 141 06/10/2014 1108   K 3.7 06/10/2014 1108   CL 105 06/10/2014 1108   CO2 27 06/10/2014 1108   GLUCOSE 92 06/10/2014 1108   BUN 20 06/10/2014 1108   CREATININE 0.92 06/10/2014 1108   CALCIUM 9.6 06/10/2014 1108   PROT 8.8* 06/10/2014 1108   ALBUMIN 3.8 06/10/2014 1108   AST 17 06/10/2014 1108   ALT 9 06/10/2014 1108   ALKPHOS 76 06/10/2014 1108   BILITOT 0.3 06/10/2014 1108   No results found for this basename: CHOL, HDL, LDLCALC, LDLDIRECT, TRIG, CHOLHDL   No results found for this basename: HGBA1C   No results found for this basename: VITAMINB12   Lab Results  Component Value Date   TSH 3.992  06/10/2014     01/18/10 MRI BRAIN [place], [place], report only): Multiple old infarcts bilaterally, the largest located in the right MCA territory distribution. Multiple areas of susceptibility on T2 star. Status post craniotomy. Right vertebral artery stenosis/occlusion.   ASSESSMENT AND PLAN  80 y.o. year old female here with progressive short term memory loss over 5+ years, and history of large right MCA hemorrhage stroke. Also with multiple bilateral lacunar infarcts and chronic cerebral microhemorrhages.   Ddx: dementia (alzheimer's vs dementia with lewy bodies vs vascular dementia)  PLAN: - continue namenda (may transition to XR  daily when possible, as namenda immediate release is being phased out) - agree with walker; consider physical therapy evaluation  - dementia activity book and information given to daughter  Return in about 1 year (around 06/29/2015), or if symptoms worsen or fail to improve.    Shaiann Mcmanamon R.  Lindie Roberson, MD 06/28/2014, 9:37 AM Certified in Neurology, Neurophysiology and Neuroimaging  [facility] [address], [address] [place], [place] [address] [phone]

## 2014-06-28 NOTE — Patient Instructions (Signed)
Continue namenda.  Agree with walker.  Consider physical therapy for fall prevention.

## 2014-06-29 ENCOUNTER — Encounter: Payer: Self-pay | Admitting: Medical

## 2014-07-05 NOTE — Telephone Encounter (Signed)
Records was sorted.

## 2014-07-15 ENCOUNTER — Telehealth: Payer: Self-pay | Admitting: Medical

## 2014-07-15 ENCOUNTER — Encounter: Payer: Self-pay | Admitting: Medical

## 2014-07-15 ENCOUNTER — Ambulatory Visit (INDEPENDENT_AMBULATORY_CARE_PROVIDER_SITE_OTHER): Payer: Medicare Other | Admitting: Medical

## 2014-07-15 VITALS — BP 130/80 | HR 82 | Temp 97.7°F | Resp 17 | Wt 108.0 lb

## 2014-07-15 DIAGNOSIS — F03B0 Unspecified dementia, moderate, without behavioral disturbance, psychotic disturbance, mood disturbance, and anxiety: Secondary | ICD-10-CM

## 2014-07-15 DIAGNOSIS — M810 Age-related osteoporosis without current pathological fracture: Secondary | ICD-10-CM

## 2014-07-15 DIAGNOSIS — F0390 Unspecified dementia without behavioral disturbance: Secondary | ICD-10-CM

## 2014-07-15 DIAGNOSIS — Q846 Other congenital malformations of nails: Secondary | ICD-10-CM

## 2014-07-15 DIAGNOSIS — E785 Hyperlipidemia, unspecified: Secondary | ICD-10-CM

## 2014-07-15 DIAGNOSIS — Z8673 Personal history of transient ischemic attack (TIA), and cerebral infarction without residual deficits: Secondary | ICD-10-CM

## 2014-07-15 DIAGNOSIS — N3946 Mixed incontinence: Secondary | ICD-10-CM

## 2014-07-15 DIAGNOSIS — N3000 Acute cystitis without hematuria: Secondary | ICD-10-CM

## 2014-07-15 DIAGNOSIS — I10 Essential (primary) hypertension: Secondary | ICD-10-CM

## 2014-07-15 DIAGNOSIS — K5900 Constipation, unspecified: Secondary | ICD-10-CM

## 2014-07-15 DIAGNOSIS — Z23 Encounter for immunization: Secondary | ICD-10-CM

## 2014-07-15 LAB — POCT URINALYSIS DIPSTICK
BILIRUBIN UA: NEGATIVE
GLUCOSE UA: NEGATIVE
Ketones, UA: NEGATIVE
Nitrite, UA: POSITIVE
Protein, UA: NEGATIVE
RBC UA: NEGATIVE
Spec Grav, UA: 1.01
UROBILINOGEN UA: NEGATIVE
pH, UA: 5

## 2014-07-15 MED ORDER — MEMANTINE HCL ER 28 MG PO CP24: 1.0000 | ORAL_CAPSULE | Freq: Every day | ORAL | Status: DC

## 2014-07-15 MED ORDER — AMLODIPINE BESYLATE 10 MG PO TABS: 10.0000 mg | ORAL_TABLET | Freq: Every day | ORAL | Status: DC

## 2014-07-15 MED ORDER — ATORVASTATIN CALCIUM 10 MG PO TABS: 10.0000 mg | ORAL_TABLET | Freq: Every day | ORAL | Status: DC

## 2014-07-15 MED ORDER — TOLTERODINE TARTRATE ER 4 MG PO CP24: 4.0000 mg | ORAL_CAPSULE | Freq: Every day | ORAL | Status: AC

## 2014-07-15 NOTE — Progress Notes (Signed)
Subjective: Here for recheck with daughter/caretaker.  Last visit she was a new patient to establish care.  Daughter supplies the history.   Since last visit has a few concerns.    Having BM once weekly.  Patient doesn't report pain or bloating though.  Drinks good amount of water.  Gets wheat bread, collards, greens, fruit, gets good amount of fiber. No fiber supplement.  curious about colace OTC.  Questions about dyslipidemia.  Running out of her cholesterol medication.    Daughter would like a handicap sticker since getting in and out of stores is difficult with her limitations.   Urine seems to be strong odor, but patient not complaining.  density irritation, frequency, urgency, no blood.   Took the complete Keflex course last visit after we found urinary tract infection.   Saw [facility] since last visit, had nail care, no c/o today.  Saw neurology since last visit, Dr. Geraldine ContrasPennumali.  PT referral was mentioned, but at this time they feel ok, don't want to pursue this.  Using the walker, has rubber soled shoes, no falls since last visit.   stil taking Namenda, but needs to refill with the XR version.  F/u planned in 1 year.  No other new concerns.    Has not had flu vaccine.    Has dentures, daughter cleans them daily.  Wears glasses, saw eye doctor in January 2015, has new glasses.     No concerns about hearing.    She is enjoying [facility] 8 hours 2 x /week on Wednesdays and Fridays.  daughter has in home nurse tuesdays which really helps.    Only other concern is mood at times down, weeping and crying at times, particular in the mornings.   ROS as in subjective  Objective: Wt Readings from Last 3 Encounters:  07/15/14 108 lb (48.988 kg)  06/28/14 107 lb 9.6 oz (48.807 kg)  06/10/14 108 lb (48.988 kg)    BP Readings from Last 3 Encounters:  07/15/14 130/80  06/28/14 142/90  06/10/14 130/80   Filed Vitals:   07/15/14 0839  BP: 130/80  Pulse: 82   Temp: 97.7 F (36.5 C)  Resp: 17    General appearance: alert, no distress, WD/WN Neck: supple, no lymphadenopathy, no thyromegaly, no masses Heart: RRR, normal S1, S2, no murmurs Lungs: CTA bilaterally, no wheezes, rhonchi, or rales Abdomen: +bs, soft, non tender, non distended, no masses, no hepatomegaly, no splenomegaly Pulses: 2+ symmetric, upper and lower extremities, normal cap refill     Assessment: Encounter Diagnoses  Name Primary?  . Moderate dementia without behavioral disturbance Yes  . Constipation, unspecified constipation type   . Acute cystitis without hematuria   . Essential hypertension   . History of stroke   . Hyperlipidemia   . Osteoporosis   . Mixed incontinence   . Need for prophylactic vaccination and inoculation against influenza   . Nail anomaly      Plan: Dementia - reviewed recent neurology notes.  They will call him first to ask about adding SSRI such as Paxil.  Change to XR Namenda now that her regular release is running out per Dr. Peggye FormPennumali's advice.    Recheck urine today given UTI found last visit.    C/t all current medications unchanged otherwise.   Constipation - no c/o but only 1BM weekly.  Will c/t good water and fiber intake.  Add Fibercon or metamucil supplement OTC daily. If not improving in 2 wk, consider adding  Miralax.  Counseled on the influenza virus vaccine.  Vaccine information sheet given.  Influenza vaccine given after consent obtained.  Osteoporosis - advised Vit D, calcium and they will consider adding Fosamax.  Fall risk - doing good job and awareness and prevention  Elevated protein - reviewed prior records.  Prior oncology eval reviewed.  Prn f/u recommended.    Recurrent UTI - will research her old chart and get back with daughter.  Discussed their wishes in terms of how we move forward.  Given her mental status, dementia and age, they will have to help decide how little or how much we do in terms of routine  care, preventative care such as mammograms, treating underlying conditions.  No specific decision made today, but wanted to start the conversation.    Patient Instructions  Recommendations:  Continue current medications unchanged  Make sure she is getting 4 servings of dairy daily for calcium  Add OTC vitamin D 1000 IU daily  Discuss adding once weekly Fosamax with your sister since she has history of Osteoporosis.  This helps (SLOWLY over time) to limit bone loss.  Begin osteoporosis means at risk for fracture.  Continue to use the walker, rubber soled shoes, reduce fall risks  Continue the [facility] program for adult day time activities, glad to hear she is enjoying this  Call neurologist's office to ask about whether he wants to put her on an antidepressant.  Let me know.  Begin daily fiber supplement to help with constipation.  Continue good fiber and water intake.    We updated her influenza vaccine today.

## 2014-07-15 NOTE — Telephone Encounter (Signed)
pls call daughter.  Prior record show UTIs, but no eval or recommendations and the recurrence.   Typically with recurrent UTI, we refer to Urology for eval and recommendation on treatment.     See if she wants referral.  If not, one option would be to try daily prophylactic antibiotic for period of time.

## 2014-07-15 NOTE — Patient Instructions (Signed)
Recommendations:  Continue current medications unchanged  Make sure she is getting 4 servings of dairy daily for calcium  Add OTC vitamin D 1000 IU daily  Discuss adding once weekly Fosamax with your sister since she has history of Osteoporosis.  This helps (SLOWLY over time) to limit bone loss.  Begin osteoporosis means at risk for fracture.  Continue to use the walker, rubber soled shoes, reduce fall risks  Continue the [facility] program for adult day time activities, glad to hear she is enjoying this  Call neurologist's office to ask about whether he wants to put her on an antidepressant.  Let me know.  Begin daily fiber supplement to help with constipation.  Continue good fiber and water intake.    We updated her influenza vaccine today.

## 2014-07-19 NOTE — Telephone Encounter (Signed)
LMOM TO CB. CLS 

## 2014-07-20 ENCOUNTER — Encounter: Payer: Self-pay | Admitting: Internal Medicine

## 2014-07-20 ENCOUNTER — Encounter: Payer: Self-pay | Admitting: Medical

## 2014-07-22 ENCOUNTER — Other Ambulatory Visit: Payer: Self-pay | Admitting: Medical

## 2014-07-22 MED ORDER — CEPHALEXIN 500 MG PO CAPS: ORAL_CAPSULE | ORAL | Status: DC

## 2014-07-22 NOTE — Telephone Encounter (Signed)
I sent 30 tablets, but take BID x 5 days, then once daily until she sees Urology.

## 2014-07-22 NOTE — Telephone Encounter (Signed)
LM to [facility] 

## 2014-07-22 NOTE — Telephone Encounter (Signed)
Spoke with daughter, she is good with scheduling Urology appointment. Did you want to treat UTI ? She took one round of Keflex for the first episode.

## 2014-07-22 NOTE — Telephone Encounter (Signed)
I'll send a medication to use once daily, but go ahead and refer to Urology

## 2014-07-22 NOTE — Telephone Encounter (Signed)
Faxed referral to Urology, advised daughter we will call with appt when we hear back from them.

## 2014-07-22 NOTE — Telephone Encounter (Signed)
Pharmacist has Rx for Keflex for 5 days and then daily until urology appt...correct?

## 2014-07-22 NOTE — Telephone Encounter (Signed)
Also advised that prescription is at pharmacy.

## 2014-07-26 ENCOUNTER — Emergency Department (HOSPITAL_COMMUNITY)
Admission: EM | Admit: 2014-07-26 | Discharge: 2014-07-26 | Disposition: A | Discharge status: Home / Self Care | Payer: Medicare Other | Attending: Emergency Medicine | Admitting: Emergency Medicine

## 2014-07-26 ENCOUNTER — Emergency Department (HOSPITAL_COMMUNITY): Payer: Medicare Other

## 2014-07-26 ENCOUNTER — Encounter (HOSPITAL_COMMUNITY): Payer: Self-pay | Admitting: Emergency Medicine

## 2014-07-26 DIAGNOSIS — F0390 Unspecified dementia without behavioral disturbance: Secondary | ICD-10-CM | POA: Diagnosis not present

## 2014-07-26 DIAGNOSIS — Z043 Encounter for examination and observation following other accident: Secondary | ICD-10-CM | POA: Diagnosis not present

## 2014-07-26 DIAGNOSIS — Y929 Unspecified place or not applicable: Secondary | ICD-10-CM | POA: Insufficient documentation

## 2014-07-26 DIAGNOSIS — Z79899 Other long term (current) drug therapy: Secondary | ICD-10-CM | POA: Diagnosis not present

## 2014-07-26 DIAGNOSIS — Y9389 Activity, other specified: Secondary | ICD-10-CM | POA: Insufficient documentation

## 2014-07-26 DIAGNOSIS — W1830XA Fall on same level, unspecified, initial encounter: Secondary | ICD-10-CM | POA: Diagnosis not present

## 2014-07-26 DIAGNOSIS — Z8673 Personal history of transient ischemic attack (TIA), and cerebral infarction without residual deficits: Secondary | ICD-10-CM | POA: Insufficient documentation

## 2014-07-26 DIAGNOSIS — I10 Essential (primary) hypertension: Secondary | ICD-10-CM | POA: Diagnosis not present

## 2014-07-26 DIAGNOSIS — W19XXXA Unspecified fall, initial encounter: Secondary | ICD-10-CM

## 2014-07-26 DIAGNOSIS — Z87891 Personal history of nicotine dependence: Secondary | ICD-10-CM | POA: Insufficient documentation

## 2014-07-26 DIAGNOSIS — N390 Urinary tract infection, site not specified: Secondary | ICD-10-CM | POA: Insufficient documentation

## 2014-07-26 DIAGNOSIS — E785 Hyperlipidemia, unspecified: Secondary | ICD-10-CM | POA: Diagnosis not present

## 2014-07-26 DIAGNOSIS — Z9049 Acquired absence of other specified parts of digestive tract: Secondary | ICD-10-CM | POA: Diagnosis not present

## 2014-07-26 LAB — URINALYSIS, ROUTINE W REFLEX MICROSCOPIC
BILIRUBIN URINE: NEGATIVE
Glucose, UA: NEGATIVE mg/dL
Hgb urine dipstick: NEGATIVE
KETONES UR: NEGATIVE mg/dL
NITRITE: POSITIVE — AB
Protein, ur: NEGATIVE mg/dL
Specific Gravity, Urine: 1.013 (ref 1.005–1.030)
UROBILINOGEN UA: 1 mg/dL (ref 0.0–1.0)
pH: 7 (ref 5.0–8.0)

## 2014-07-26 LAB — URINE MICROSCOPIC-ADD ON

## 2014-07-26 LAB — BASIC METABOLIC PANEL
ANION GAP: 15 (ref 5–15)
BUN: 20 mg/dL (ref 6–23)
CALCIUM: 9.5 mg/dL (ref 8.4–10.5)
CHLORIDE: 100 meq/L (ref 96–112)
CO2: 21 mEq/L (ref 19–32)
CREATININE: 0.92 mg/dL (ref 0.50–1.10)
GFR, EST AFRICAN AMERICAN: 66 mL/min — AB (ref >90)
GFR, EST NON AFRICAN AMERICAN: 57 mL/min — AB (ref >90)
Glucose, Bld: 89 mg/dL (ref 70–99)
Potassium: 4 mEq/L (ref 3.7–5.3)
Sodium: 136 mEq/L — ABNORMAL LOW (ref 137–147)

## 2014-07-26 LAB — CBC
HCT: 36.3 % (ref 36.0–46.0)
Hemoglobin: 12.1 g/dL (ref 12.0–15.0)
MCH: 27.6 pg (ref 26.0–34.0)
MCHC: 33.3 g/dL (ref 30.0–36.0)
MCV: 82.9 fL (ref 78.0–100.0)
Platelets: 120 10*3/uL — ABNORMAL LOW (ref 150–400)
RBC: 4.38 MIL/uL (ref 3.87–5.11)
RDW: 16.7 % — AB (ref 11.5–15.5)
WBC: 3 10*3/uL — AB (ref 4.0–10.5)

## 2014-07-26 MED ORDER — SODIUM CHLORIDE 0.9 % IV BOLUS (SEPSIS)
500.0000 mL | Freq: Once | INTRAVENOUS | Status: AC
  Administered 2014-07-26: 500 mL via INTRAVENOUS

## 2014-07-26 MED ORDER — NITROFURANTOIN MONOHYD MACRO 100 MG PO CAPS: 100.0000 mg | ORAL_CAPSULE | Freq: Two times a day (BID) | ORAL | Status: DC

## 2014-07-26 MED ORDER — DEXTROSE 5 % IV SOLN
1.0000 g | Freq: Once | INTRAVENOUS | Status: AC
  Administered 2014-07-26: 1 g via INTRAVENOUS
  Filled 2014-07-26: qty 10

## 2014-07-26 NOTE — ED Notes (Addendum)
Per EMS pt comes from home.  Daughter reports hearing pt fall in bathroom around 430am, Pt able to return to bed.  Pt's daughter tried again at 630 to take mother to bathroom, reports pt unable to ambulate and "not herself".  Pt's daughter reports pt didn't know her own name.  Pt's daughter reports pt diagnosed with UTI Friday and slept all day yesterday.  Reports weakness and increased confusion over the weekend.  Upon assessment pt AOx1. Pt does have a hx of dementia and daughter states worse than normal baseline.

## 2014-07-26 NOTE — ED Notes (Signed)
Patient transported to CT 

## 2014-07-26 NOTE — Discharge Instructions (Signed)
Return to the ED with any concerns including vomiting and not able to keep down liquids or antibiotics, fainting, decreased level of alertness/lethargy, or any other alarming symptoms

## 2014-07-26 NOTE — ED Notes (Signed)
Bed: WA12 Expected date:  Expected time:  Means of arrival:  Comments: EMS-weakness 

## 2014-07-26 NOTE — ED Notes (Signed)
Patient to Radiology

## 2014-07-26 NOTE — ED Notes (Signed)
Family requesting IV maintenance fluids due to UTI and pt not drinking frequently.  Made Linker EDP aware. New orders given

## 2014-07-26 NOTE — ED Notes (Signed)
Patient just returned from radiology.  

## 2014-07-26 NOTE — ED Provider Notes (Signed)
CSN: [id number]     Arrival date & time 07/26/14  0805 History   First MD Initiated Contact with Patient 07/26/14 [phone]     Chief Complaint  Patient presents with  . Fall     (Consider location/radiation/quality/duration/timing/severity/associated sxs/prior Treatment) HPI A LEVEL 5 CAVEAT PERTAINS DUE TO DEMENTIA Pt presents after 2 falls this morning.  Per family she was up to bathroom and fell, then was able to get back to bed on her own.  Several hours later she was unable to get out of bed on her own.  Has dementia but was unable to tell family her name.  She has been on antibiotics x 3 days for UTI- slept most of day yesterday.   Past Medical History  Diagnosis Date  . Stroke 1988    resulting cognition problems and dementia  . Dementia     [place].  on Namenda since 2012  . Hypertension   . Full dentures   . Urinary incontinence     wears pads daily, but also uses toilet  . Hyperlipidemia    Past Surgical History  Procedure Laterality Date  . No past surgeries    . Abdominal hysterectomy     Family History  Problem Relation Age of Onset  . Cancer Mother    History  Substance Use Topics  . Smoking status: Former Games developermoker  . Smokeless tobacco: Never Used  . Alcohol Use: No   OB History   Grav Para Term Preterm Abortions TAB SAB Ect Mult Living                 Review of Systems UNABLE TO OBTAIN ROS DUE TO LEVEL 5 CAVEAT    Allergies  Sulfa antibiotics  Home Medications   Prior to Admission medications   Medication Sig Start Date End Date Taking? Authorizing Provider  amLODipine (NORVASC) 10 MG tablet Take 1 tablet (10 mg total) by mouth daily. 07/15/14  Yes Kermit Baloavid S Tysinger, PA-C  atorvastatin (LIPITOR) 10 MG tablet Take 1 tablet (10 mg total) by mouth daily. 07/15/14  Yes Kermit Baloavid S Tysinger, PA-C  cephALEXin (KEFLEX) 500 MG capsule Take 500 mg by mouth 3 (three) times daily. Started 07/24/14- 07/30/2014   Yes Historical  Provider, MD  tolterodine (DETROL LA) 4 MG 24 hr capsule Take 1 capsule (4 mg total) by mouth daily. 07/15/14  Yes Kermit Baloavid S Tysinger, PA-C  Memantine HCl ER (NAMENDA XR) 28 MG CP24 Take 28 mg by mouth daily. 07/15/14   Kermit Baloavid S Tysinger, PA-C  nitrofurantoin, macrocrystal-monohydrate, (MACROBID) 100 MG capsule Take 1 capsule (100 mg total) by mouth 2 (two) times daily. 07/26/14   Ethelda ChickMartha K Linker, MD   BP 148/72  Pulse 82  Temp(Src) 98.3 F (36.8 C) (Oral)  Resp 20  SpO2 100% Vitals reviewed Physical Exam Physical Examination: General appearance - alert, well appearing, and in no distress Mental status - alert, oriented to person not to place or time Eyes - no conjunctival injection, no scleral icterus Mouth - mucous membranes moist, pharynx normal without lesions Chest - clear to auscultation, no wheezes, rales or rhonchi, symmetric air entry Heart - normal rate, regular rhythm, normal S1, S2, no murmurs, rubs, clicks or gallops Abdomen - soft, nontender, nondistended, no masses or organomegaly Extremities - peripheral pulses normal, no pedal edema, no clubbing or cyanosis Skin - normal coloration and turgor, no rashes  ED Course  Procedures (including critical care time) Labs Review Labs  Reviewed  CBC - Abnormal; Notable for the following:    WBC 3.0 (*)    RDW 16.7 (*)    Platelets 120 (*)    All other components within normal limits  BASIC METABOLIC PANEL - Abnormal; Notable for the following:    Sodium 136 (*)    GFR calc non Af Amer 57 (*)    GFR calc Af Amer 66 (*)    All other components within normal limits  URINALYSIS, ROUTINE W REFLEX MICROSCOPIC - Abnormal; Notable for the following:    APPearance CLOUDY (*)    Nitrite POSITIVE (*)    Leukocytes, UA MODERATE (*)    All other components within normal limits  URINE MICROSCOPIC-ADD ON - Abnormal; Notable for the following:    Squamous Epithelial / LPF FEW (*)    Bacteria, UA FEW (*)    All other components within  normal limits    Imaging Review Dg Lumbar Spine Complete  07/26/2014   CLINICAL DATA:  80 year old female with history of recent fall in the bathroom, unable to ambulate. Recent diagnosis of urinary tract infection.  EXAM: LUMBAR SPINE - COMPLETE 4+ VIEW  COMPARISON:  None.  FINDINGS: Multiple views of the lumbar spine demonstrate no acute displaced fracture or definite compression type fracture. Mild levoscoliosis of the lower lumbar spine convex to the left at the level of L3-L4. Alignment is otherwise anatomic. Multilevel degenerative disc disease and lumbar spondylosis, most pronounced at L4-L5. Atherosclerotic calcifications throughout the visualized vasculature. Calcifications project over the upper quadrants of the abdomen bilaterally measuring up to 12 mm on the right and 13 mm on the left, suspicious for renal calculi.  IMPRESSION: 1. No acute radiographic abnormality of the lumbar spine. 2. Mild multilevel degenerative disc disease and lumbar spondylosis, as above. 3. Large calcifications projecting over the kidneys bilaterally suspicious for renal calculi. These could be confirmed with noncontrast CT of the abdomen and pelvis if clinically appropriate. 4. Atherosclerosis.   Electronically Signed   By: Trudie Reed M.D.   On: 07/26/2014 09:28   Dg Hip Complete Right  07/26/2014   CLINICAL DATA:  Status post fall 4:30 a.m. this morning. Right hip pain.  EXAM: RIGHT HIP - COMPLETE 2+ VIEW  COMPARISON:  None.  FINDINGS: Both hips are located. No fracture is identified. No notable degenerative change is seen. Soft tissue structures are unremarkable.  IMPRESSION: Negative exam.   Electronically Signed   By: Drusilla Kanner M.D.   On: 07/26/2014 09:25   Ct Head Wo Contrast  07/26/2014   CLINICAL DATA:  Status post fall at 4:30 a.m. this morning. Altered mental status.  EXAM: CT HEAD WITHOUT CONTRAST  TECHNIQUE: Contiguous axial images were obtained from the base of the skull through the vertex  without intravenous contrast.  COMPARISON:  None.  FINDINGS: The brain is atrophic with chronic microvascular ischemic change and remote temporoparietal infarct. Remote appearing lacunar infarction in the left thalamus is also identified. No acute abnormality including hemorrhage, infarct, mass lesion, mass effect, midline shift or abnormal extra-axial fluid collection is seen. Right side and occipital craniotomy defect is noted.  IMPRESSION: No acute abnormality.  Atrophy, chronic microvascular ischemic change and remote infarcts as above.   Electronically Signed   By: Drusilla Kanner M.D.   On: 07/26/2014 09:15     EKG Interpretation   Date/Time:  Monday July 26 2014 08:35:49 EDT Ventricular Rate:  81 PR Interval:  167 QRS Duration: 74 QT Interval:  397 QTC  Calculation: 461 R Axis:   2 Text Interpretation:  Sinus rhythm Probable LVH with secondary repol abnrm  Anterior Q waves, possibly due to LVH Baseline wander in lead(s) V3 V4 No  old tracing to compare Confirmed by [facility]  MD, Jaxden Blyden [phone]([id number]) on  07/26/2014 1:06:55 PM      MDM   Final diagnoses:  Fall, initial encounter  UTI (lower urinary tract infection)    Pt presenting with c/o fall and altered mental status.  Head CT and xrays are reassuring.  Urine shows UTI despite 48-72 hours of treatment with keflex.  Reviewed prior urine culture results and chose macrobid as pt allergic to sulfa, prior culture was cipro resistant and patiently currently not seeing results on keflex.  Discussed results and plan with daughter at bedside.  Discharged with strict return precautions.  Pt agreeable with plan.    Ethelda ChickMartha K Linker, MD 07/26/14 1420

## 2014-07-27 ENCOUNTER — Other Ambulatory Visit: Payer: Self-pay | Admitting: Family Medicine

## 2014-07-27 DIAGNOSIS — N390 Urinary tract infection, site not specified: Secondary | ICD-10-CM

## 2014-07-29 NOTE — Telephone Encounter (Signed)
LM to [facility] 

## 2014-07-30 ENCOUNTER — Encounter: Payer: Self-pay | Admitting: Internal Medicine

## 2014-09-29 ENCOUNTER — Telehealth: Payer: Self-pay | Admitting: Medical

## 2014-09-29 ENCOUNTER — Ambulatory Visit (INDEPENDENT_AMBULATORY_CARE_PROVIDER_SITE_OTHER): Payer: Medicare Other | Admitting: Medical

## 2014-09-29 ENCOUNTER — Encounter: Payer: Self-pay | Admitting: Medical

## 2014-09-29 VITALS — BP 110/80 | HR 84 | Temp 97.9°F | Resp 14 | Wt 105.0 lb

## 2014-09-29 DIAGNOSIS — K921 Melena: Secondary | ICD-10-CM

## 2014-09-29 DIAGNOSIS — K644 Residual hemorrhoidal skin tags: Secondary | ICD-10-CM

## 2014-09-29 DIAGNOSIS — K5900 Constipation, unspecified: Secondary | ICD-10-CM

## 2014-09-29 DIAGNOSIS — L84 Corns and callosities: Secondary | ICD-10-CM

## 2014-09-29 DIAGNOSIS — K648 Other hemorrhoids: Secondary | ICD-10-CM

## 2014-09-29 MED ORDER — POLYETHYLENE GLYCOL 3350 17 GM/SCOOP PO POWD: 1.0000 | Freq: Every day | ORAL | Status: AC

## 2014-09-29 MED ORDER — DOCUSATE SODIUM 100 MG PO CAPS: 100.0000 mg | ORAL_CAPSULE | Freq: Two times a day (BID) | ORAL | Status: AC

## 2014-09-29 MED ORDER — HYDROCORTISONE 2.5 % EX CREA: TOPICAL_CREAM | Freq: Two times a day (BID) | CUTANEOUS | Status: AC

## 2014-09-29 NOTE — Telephone Encounter (Signed)
Refer to podiatry

## 2014-09-29 NOTE — Progress Notes (Signed)
Subjective: Daughter brings her in for one recent episode of blood in the stool.   Saw bright red blood on the toilet seat once.   Has long hx/o constipation.  Having 1 BM weekly.   Has hx/o hemorrhoid.  No recent fever, weight loss or other bowel changes. Taking Airline pilotAlign and Benifiber.   Has corns or calluses of both small toes that are tender, and she won't let her daughter touch them.  Needs to redo the handicap sticker request givne a technicality as noted by DMV  ROS as in subjective  Objective: Gen:wd, wn,nad Skin: callus of bilat 5th toes along entire toe with crusting and somewhat tender MSK: bunions of both feet present Rectal - she has 3mm small hemorrhoid that has purplish coloration like it was recently bleeding, but not painful.   There is a larger nontender 8mm size hemorrhoid.  No fissure noted   Assessment: Encounter Diagnoses  Name Primary?  . External hemorrhoid Yes  . Blood in stool   . Corn or callus   . Constipation, unspecified constipation type    Plan: Blood in stool, external hemorrhoid, constipation -  Begin Miralax daily, colace prn, good water intake, and hydrocortisone topically to the hemorrhoid.  If blood in stool continue, let me know . Otherwise recheck on constipation in 3-4 weeks.  Callus - referral to podiatry  We made a new handicap stick request given the prior issue with patient vs daughter name.  daughter is caretaker for patient.   Patient has no license but can't walk very far, needs close proximity to stores.

## 2014-10-04 NOTE — Telephone Encounter (Signed)
I fax over the patients referral to [facility] fax # [phone] D  [facility] [place] [place] They will contact the patient for her appointment

## 2015-06-27 ENCOUNTER — Ambulatory Visit: Payer: Medicare Other | Admitting: Diagnostic Neuroimaging

## 2015-07-08 ENCOUNTER — Other Ambulatory Visit: Payer: Self-pay | Admitting: Medical

## 2015-07-11 NOTE — Telephone Encounter (Signed)
IS THIS OKAY TO REFILL 

## 2015-07-16 ENCOUNTER — Other Ambulatory Visit: Payer: Self-pay | Admitting: Medical

## 2015-07-18 MED ORDER — AMLODIPINE BESYLATE 10 MG PO TABS: 10.0000 mg | ORAL_TABLET | Freq: Every day | ORAL | Status: AC

## 2015-07-18 NOTE — Addendum Note (Signed)
Addended by: Herminio Commons A on: 07/18/2015 08:05 AM   Modules accepted: Orders

## 2015-07-18 NOTE — Telephone Encounter (Signed)
Resent bp med as it didn't go to pharmacy

## 2015-08-23 ENCOUNTER — Encounter

## 2015-08-23 ENCOUNTER — Inpatient Hospital Stay: Admit: 2015-08-23 | Payer: MEDICARE | Primary: Internal Medicine

## 2015-08-23 DIAGNOSIS — Z111 Encounter for screening for respiratory tuberculosis: Secondary | ICD-10-CM

## 2015-09-19 IMAGING — CR DG LUMBAR SPINE COMPLETE 4+V
5 series · 5 of 5 positions shown · non-contrast
Comparison: None.

CLINICAL DATA: 83-year-old female with history of recent fall in
the bathroom, unable to ambulate. Recent diagnosis of urinary tract
infection.

EXAM:
LUMBAR SPINE - COMPLETE 4+ VIEW

[t lumbar spine ap]
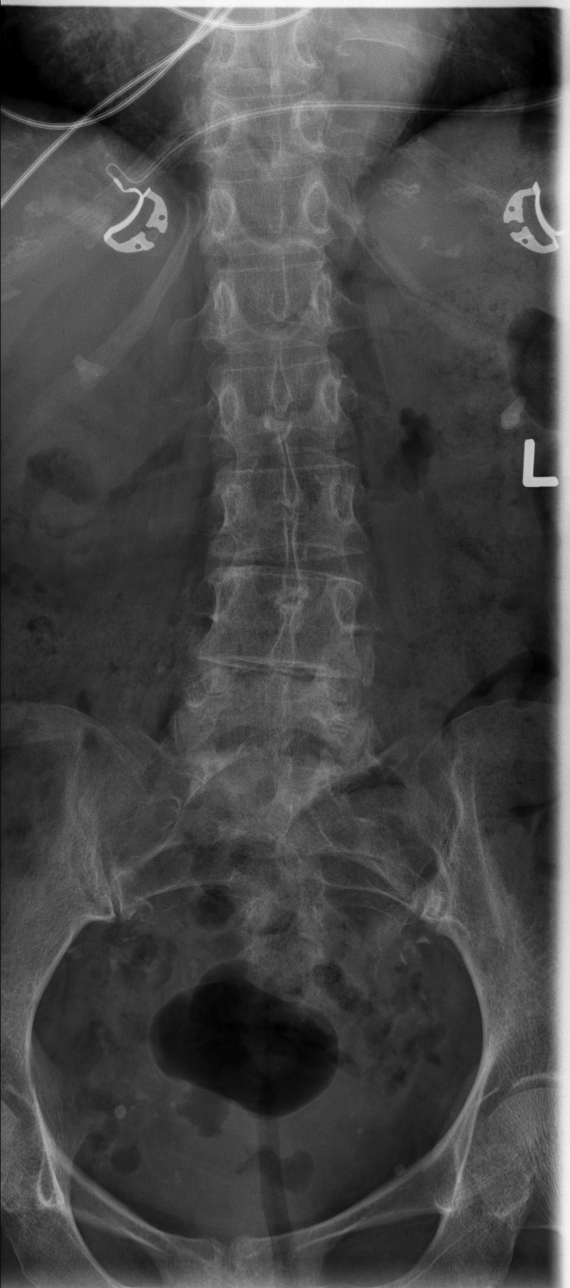

[t lumbar spine obl (1 of 2)]
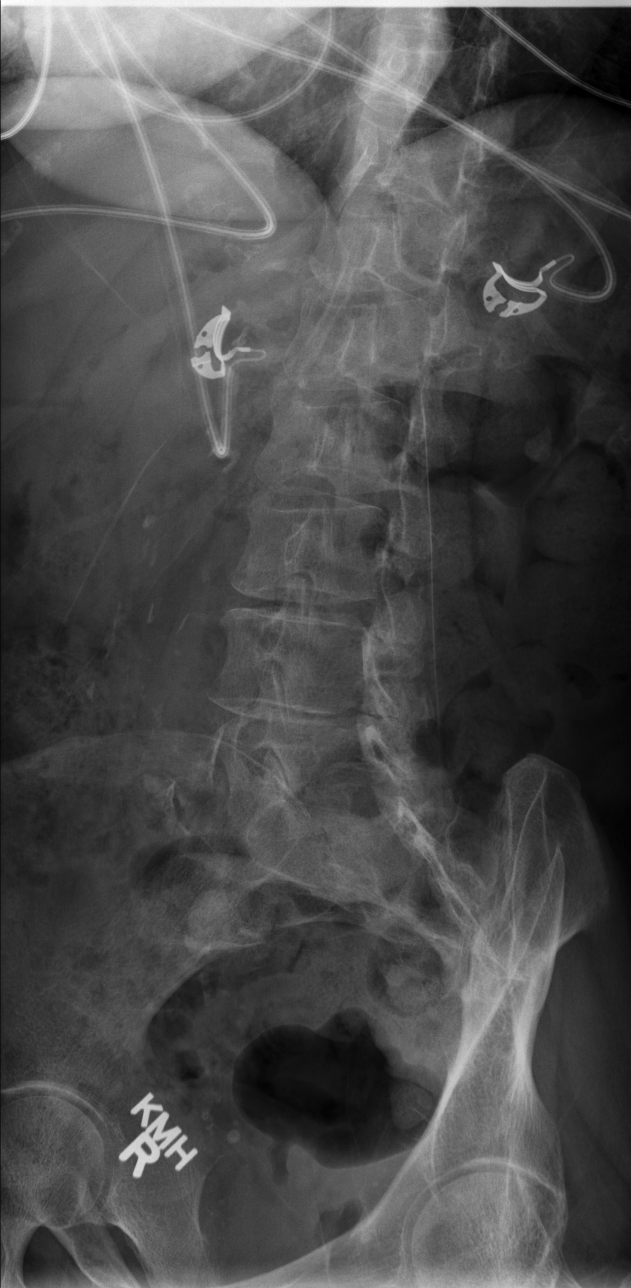

[t lumbar spine obl (2 of 2)]
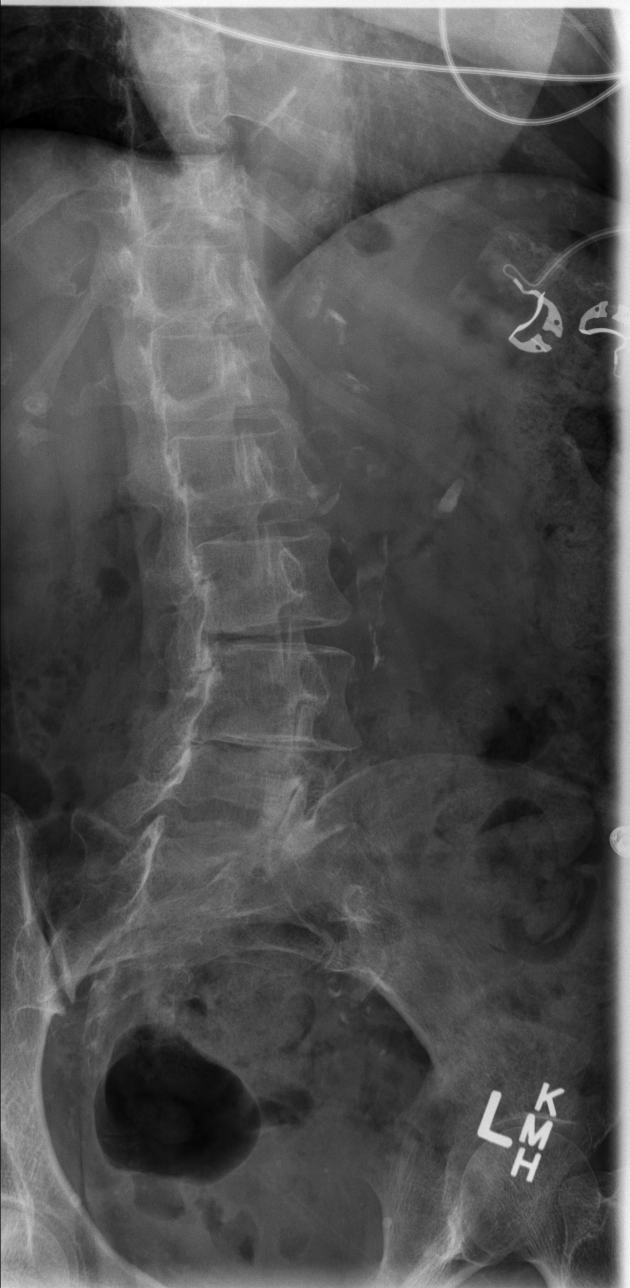

[t lumbar spine lat]
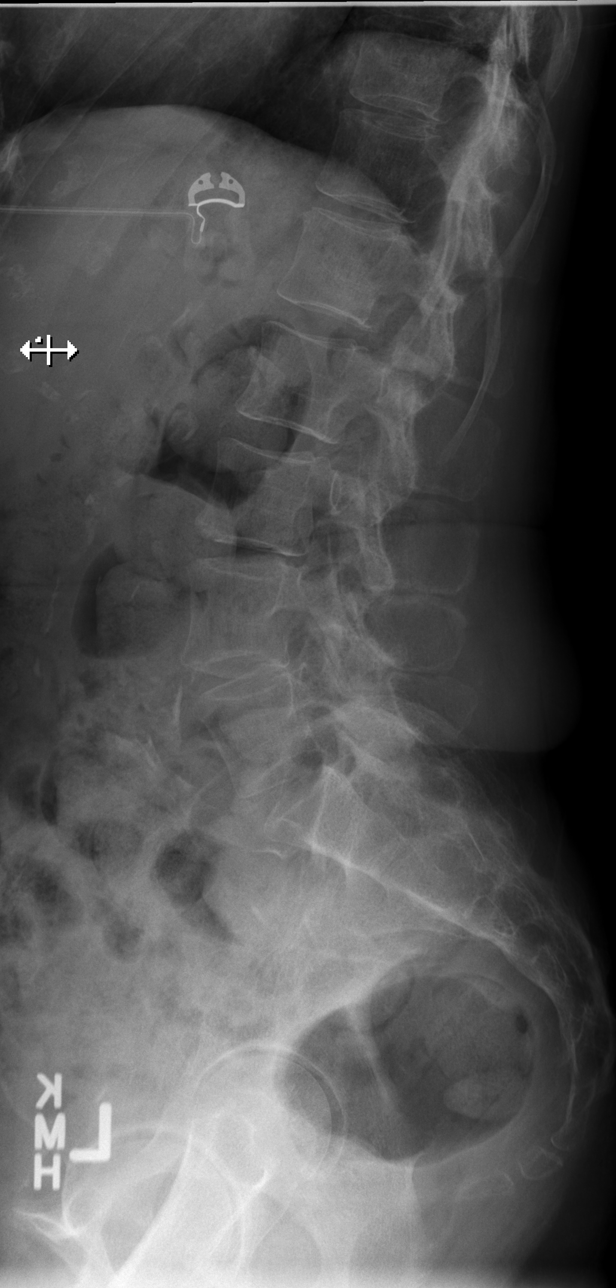

[t lumbar l-5 s-1 spot]
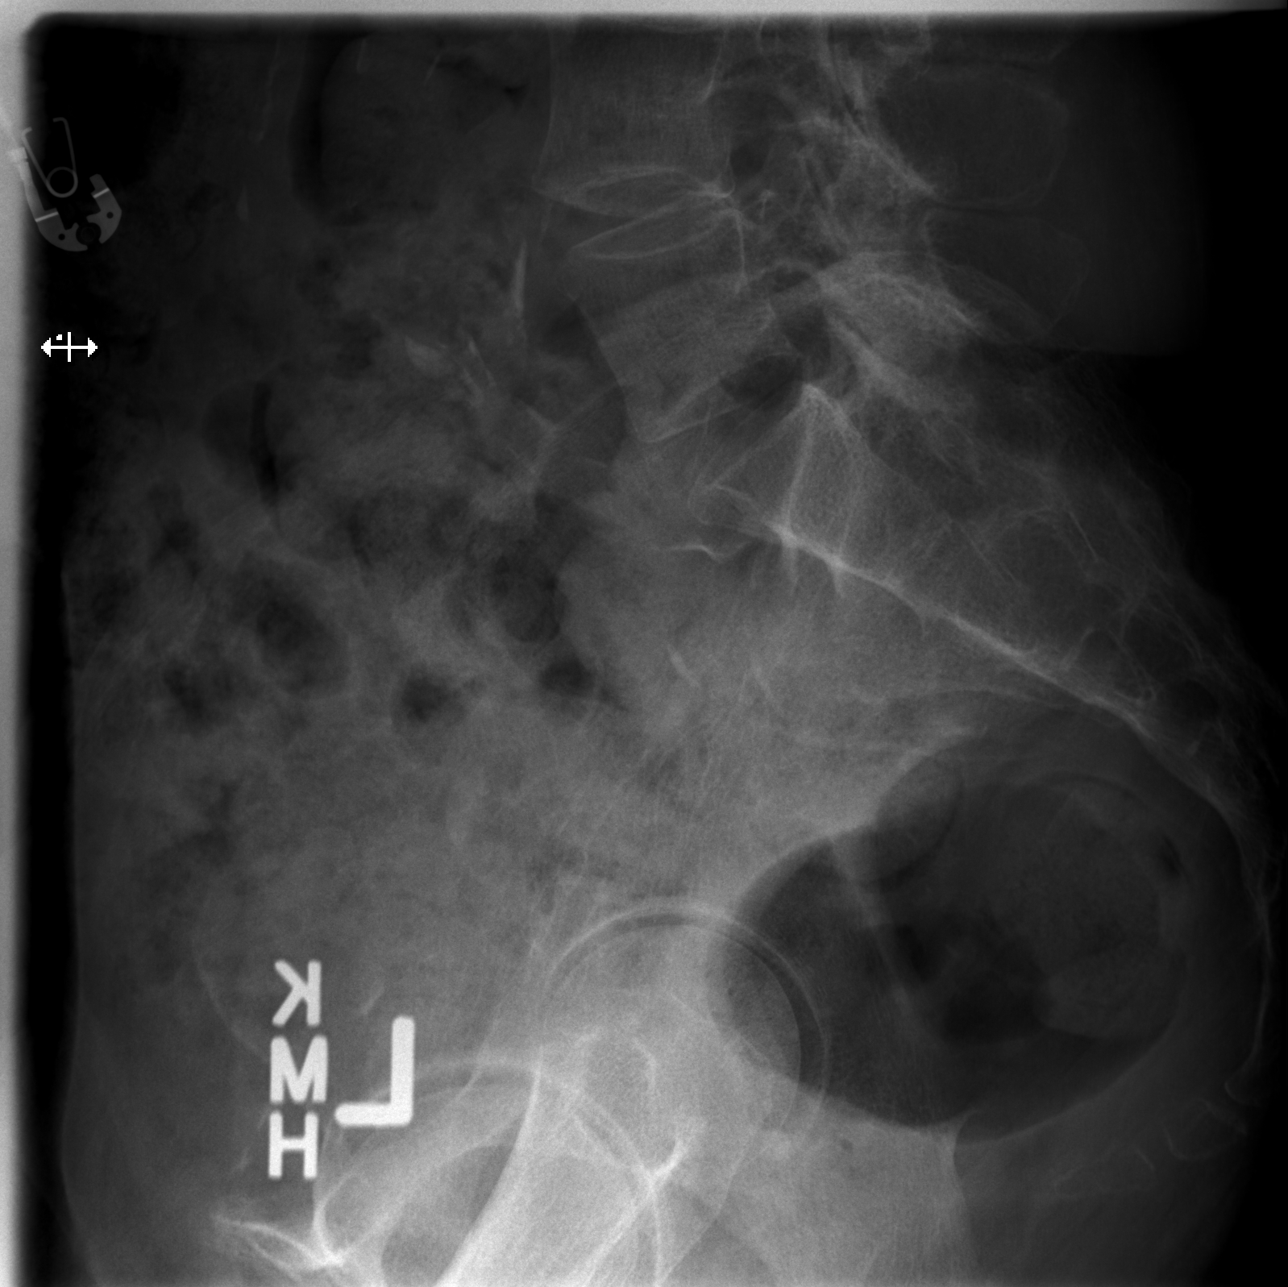

[5 of 5 positions shown; findings below may reference images not displayed]

FINDINGS: Multiple views of the lumbar spine demonstrate no acute displaced
fracture or definite compression type fracture. Mild levoscoliosis
of the lower lumbar spine convex to the left at the level of L3-L4.
Alignment is otherwise anatomic. Multilevel degenerative disc
disease and lumbar spondylosis, most pronounced at L4-L5.
Atherosclerotic calcifications throughout the visualized
vasculature. Calcifications project over the upper quadrants of the
abdomen bilaterally measuring up to 12 mm on the right and 13 mm on
the left, suspicious for renal calculi.
IMPRESSION: 1. No acute radiographic abnormality of the lumbar spine.
2. Mild multilevel degenerative disc disease and lumbar spondylosis,
as above.
3. Large calcifications projecting over the kidneys bilaterally
suspicious for renal calculi. These could be confirmed with
noncontrast CT of the abdomen and pelvis if clinically appropriate.
4. Atherosclerosis.

## 2015-09-19 IMAGING — CT CT HEAD W/O CM
2 series · 17 of 30 positions shown, 20 images · non-contrast
Comparison: None.

CLINICAL DATA: Status post fall at [DATE] a.m. this morning. Altered
mental status.

EXAM:
CT HEAD WITHOUT CONTRAST
TECHNIQUE: Contiguous axial images were obtained from the base of the skull
through the vertex without intravenous contrast.

[Series 2: head w/o · axial · non-contrast · 0.45mm/px · z∈[-129,-14]mm · 9 of 29 slices shown, 12 images]
[im 3/29  brain]
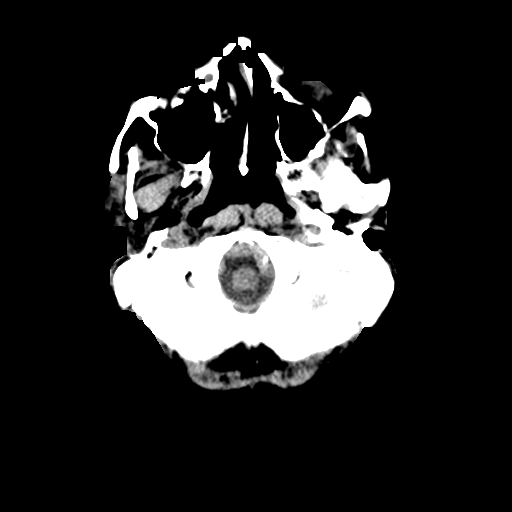
[im 3/29  bone]
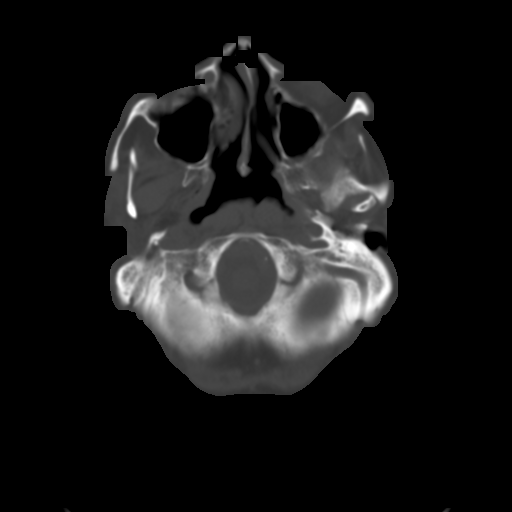
[im 6/29  brain]
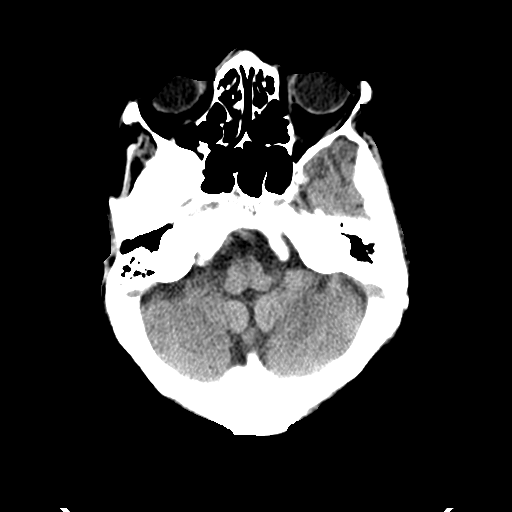
[im 9/29  brain]
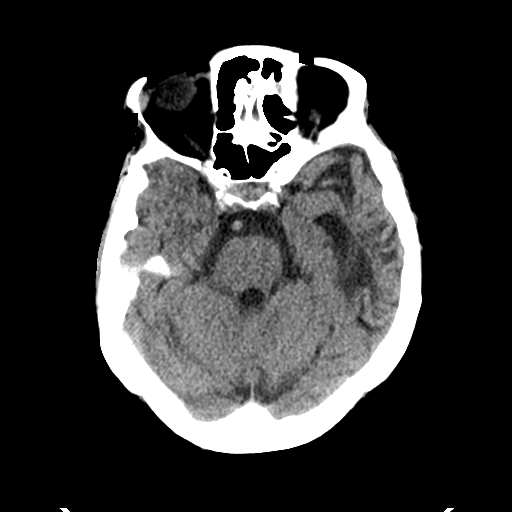
[im 12/29  brain]
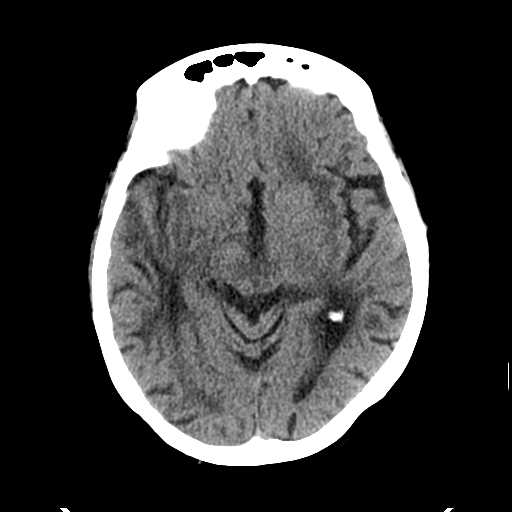
[im 15/29  brain]
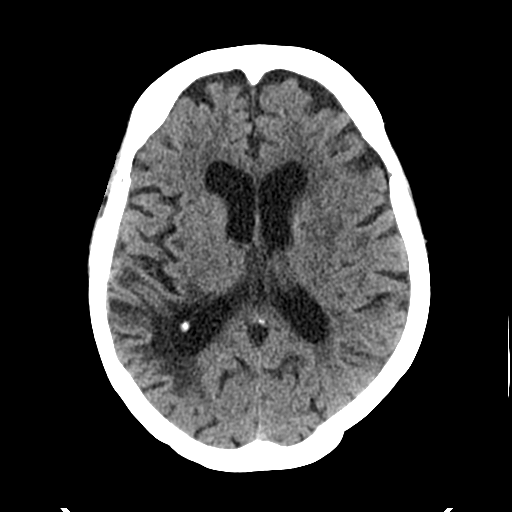
[im 15/29  bone]
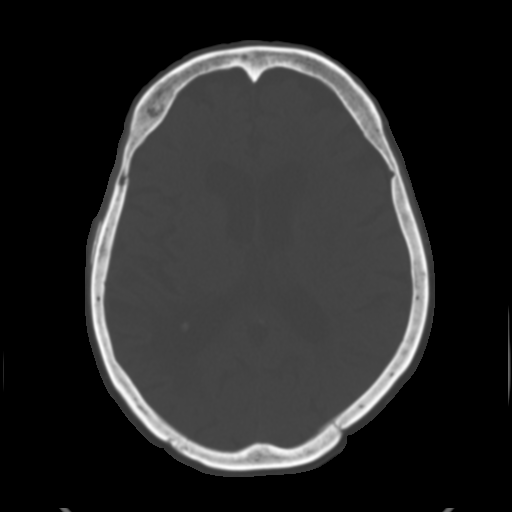
[im 17/29  brain]
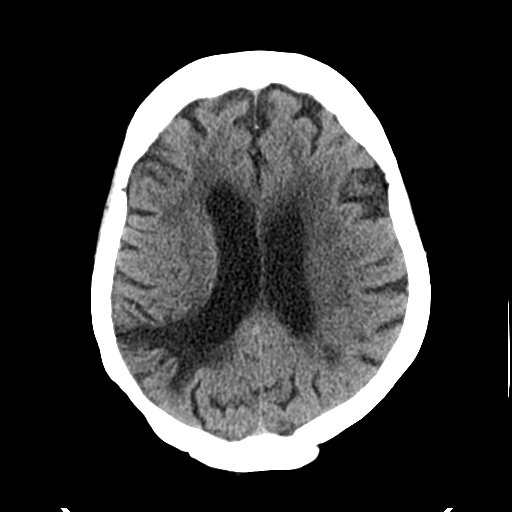
[im 20/29  brain]
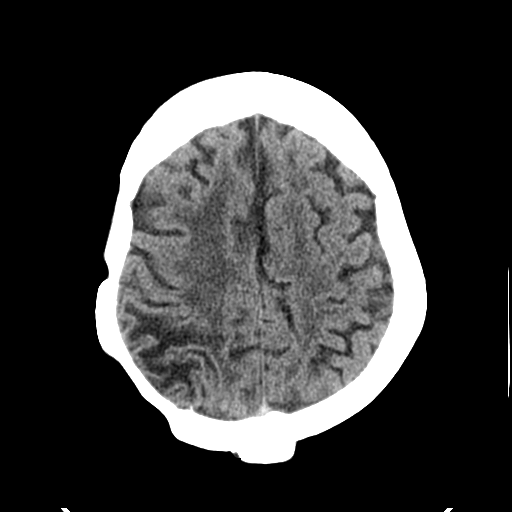
[im 23/29  brain]
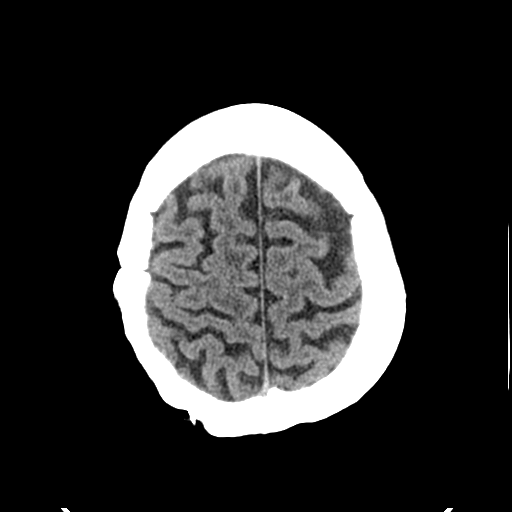
[im 26/29  brain]
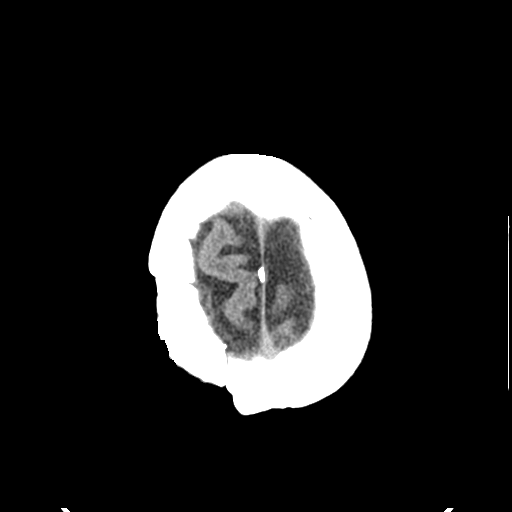
[im 26/29  bone]
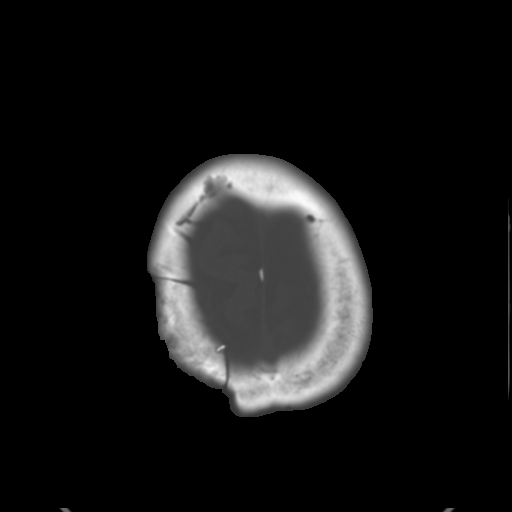

[Series 3: bone windows · axial · 0.45mm/px · z∈[-124,-16]mm · 8 of 48 slices shown]
[im 6/48  bone]
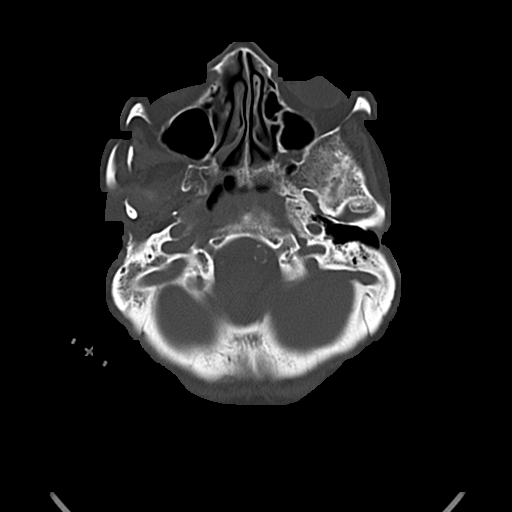
[im 11/48  bone]
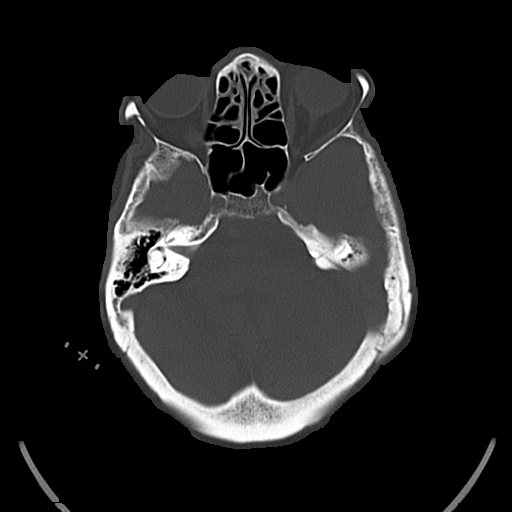
[im 16/48  bone]
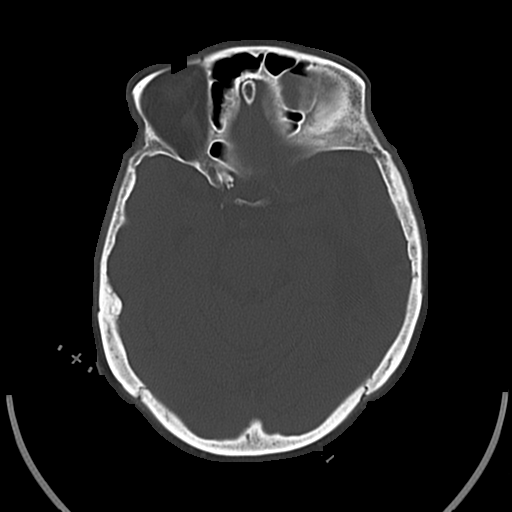
[im 21/48  bone]
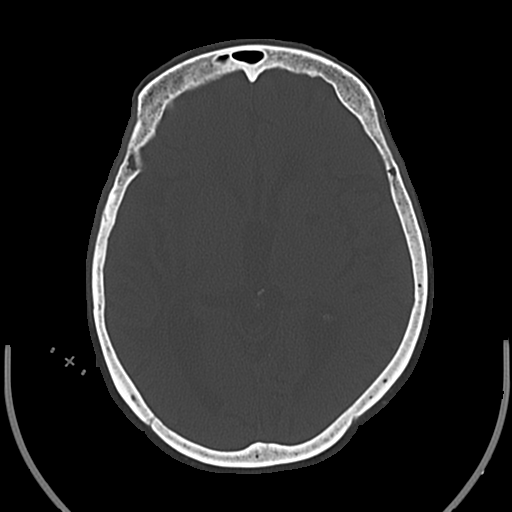
[im 27/48  bone]
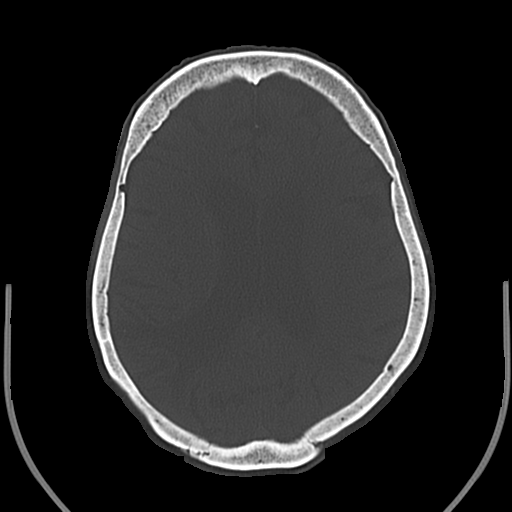
[im 32/48  bone]
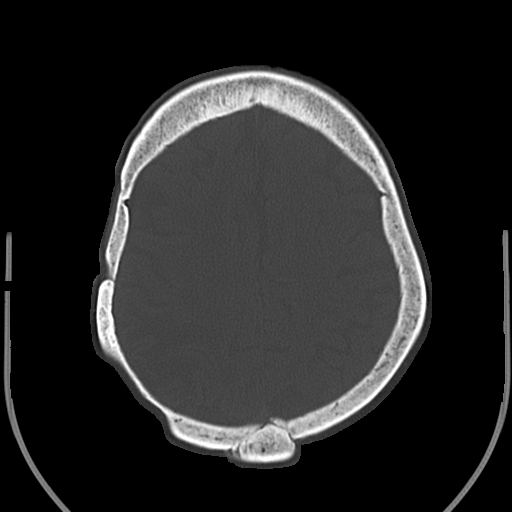
[im 37/48  bone]
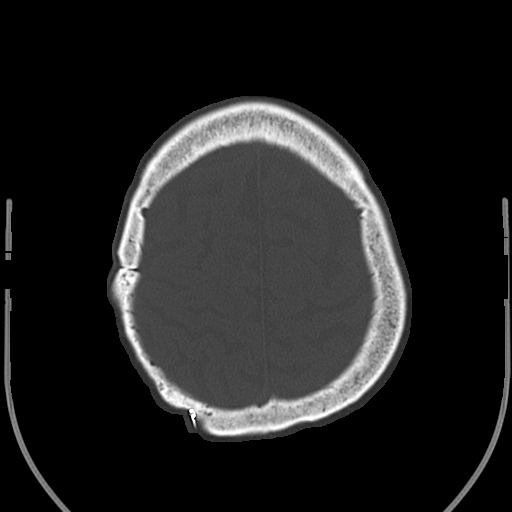
[im 42/48  bone]
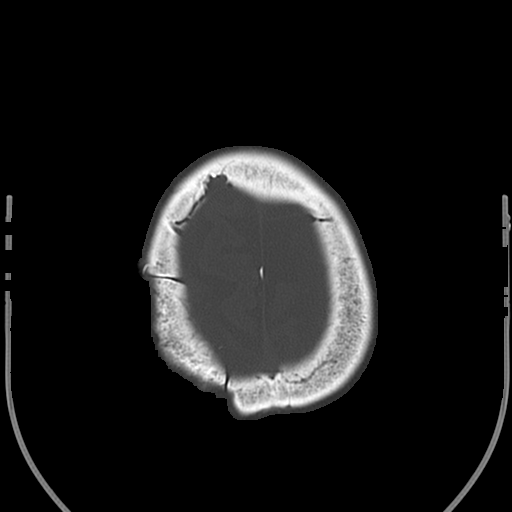

[17 of 30 positions shown; findings below may reference images not displayed]

FINDINGS: The brain is atrophic with chronic microvascular ischemic change and
remote temporoparietal infarct. Remote appearing lacunar infarction
in the left thalamus is also identified. No acute abnormality
including hemorrhage, infarct, mass lesion, mass effect, midline
shift or abnormal extra-axial fluid collection is seen. Right side
and occipital craniotomy defect is noted.
IMPRESSION: No acute abnormality.

Atrophy, chronic microvascular ischemic change and remote infarcts
as above.

## 2017-03-21 ENCOUNTER — Emergency Department: Admit: 2017-03-22 | Payer: Medicare (Managed Care) | Primary: Internal Medicine

## 2017-03-21 DIAGNOSIS — S0181XA Laceration without foreign body of other part of head, initial encounter: Secondary | ICD-10-CM

## 2017-03-21 NOTE — ED Provider Notes (Signed)
EMERGENCY DEPARTMENT HISTORY AND PHYSICAL EXAM      Date: 03/21/2017  Patient Name: Michele Osborne    History of Presenting Illness     Chief Complaint   Patient presents with   ??? Fall     lost balance when coming out of BR @ [facility].   ??? Laceration     to left temporal area       History Provided By: Patient daughter and EMS    HPI: Michele Osborne, 83 y.o. female with PMHx significant for dementia, hypothyroid, HTN and stroke, presents via EMS with family to the ED for evaluation of mild to moderate, L shoulder pain s/p fall at nursing home at 2020. Pt also presents with laceration lateral to L eye with associated pain. EMS reports pt lost balance in the bathroom and fell forward into the sink. Family denies any mental status changes. Family states tetanus is up to date. Family denies any recent fever, chills, cough or congestion.     There are no other complaints, changes, or physical findings at this time.    PCP: Shearon Balo, NP    Current Outpatient Prescriptions   Medication Sig Dispense Refill   ??? mirtazapine (REMERON SOL-TAB) 15 mg disintegrating tablet Take 15 mg by mouth nightly.     ??? memantine ER (NAMENDA XR) 28 mg capsule Take 28 mg by mouth daily.     ??? levothyroxine (SYNTHROID) 25 mcg tablet Take 25 mcg by mouth Daily (before breakfast).     ??? docusate sodium (COLACE) 100 mg capsule Take 100 mg by mouth two (2) times a day.     ??? cranberry extract 450 mg tab tablet Take 425 mg by mouth daily.         Past History     Past Medical History:  Past Medical History:   Diagnosis Date   ??? Dementia    ??? Endocrine disease     hypothyroid   ??? Hypertension    ??? Stroke ([place]) 1989       Past Surgical History:  Past Surgical History:   Procedure Laterality Date   ??? HX GYN      fibroids       Family History:  History reviewed. No pertinent family history.    Social History:  Social History   Substance Use Topics   ??? Smoking status: Former Smoker   ??? Smokeless tobacco: Never Used      Comment: unsure    ??? Alcohol use No       Allergies:  Allergies   Allergen Reactions   ??? Sulfa (Sulfonamide Antibiotics) Unknown (comments)         Review of Systems   Review of Systems   Constitutional: Negative for activity change, appetite change, fatigue and fever.   HENT: Negative.  Negative for congestion, rhinorrhea and sore throat.    Respiratory: Negative.  Negative for cough, shortness of breath and wheezing.    Cardiovascular: Negative.  Negative for chest pain and leg swelling.   Gastrointestinal: Negative.  Negative for abdominal distention, abdominal pain, constipation, diarrhea, nausea and vomiting.   Endocrine: Negative.    Genitourinary: Negative for difficulty urinating, dysuria, menstrual problem, vaginal bleeding and vaginal discharge.   Musculoskeletal: Positive for arthralgias (L shoulder). Negative for joint swelling and myalgias.   Skin: Positive for wound (laceration ). Negative for rash.   Neurological: Negative.  Negative for dizziness, weakness, light-headedness and headaches.   Psychiatric/Behavioral: Negative.  Physical Exam   Physical Exam   Constitutional: She appears well-developed and well-nourished.   Well appearing elderly female   HENT:   Head: Atraumatic.   Eyes: EOM are normal.   Cardiovascular: Normal rate, regular rhythm, normal heart sounds and intact distal pulses.  Exam reveals no gallop and no friction rub.    No murmur heard.  Pulmonary/Chest: Effort normal and breath sounds normal. No respiratory distress. She has no wheezes. She has no rales. She exhibits no tenderness.   Abdominal: Soft. Bowel sounds are normal. She exhibits no distension and no mass. There is no tenderness. There is no rebound and no guarding.   Musculoskeletal: She exhibits no edema.   L anterior shoulder tenderness without obvious deformity. No hip tenderness. LE not internally or externally rotated. No LE length discrepancy.    Neurological: She is alert.   Neurological baseline   Skin: Skin is warm.    3 cm laceration lateral to L eye with mild bleeding   Psychiatric: She has a normal mood and affect.   Nursing note and vitals reviewed.    Diagnostic Study Results     Labs -     Recent Results (from the past 12 hour(s))   EKG, 12 LEAD, INITIAL    Collection Time: 03/21/17 10:49 PM   Result Value Ref Range    Ventricular Rate 95 BPM    Atrial Rate 95 BPM    P-R Interval 156 ms    QRS Duration 62 ms    Q-T Interval 390 ms    QTC Calculation (Bezet) 490 ms    Calculated P Axis 28 degrees    Calculated R Axis 16 degrees    Calculated T Axis 56 degrees    Diagnosis       Sinus rhythm with sinus arrhythmia with fusion complexes  Marked ST abnormality, possible lateral subendocardial injury  Prolonged QT  No previous ECGs available     CBC WITH AUTOMATED DIFF    Collection Time: 03/21/17 10:53 PM   Result Value Ref Range    WBC 3.8 3.6 - 11.0 K/uL    RBC 3.64 (L) 3.80 - 5.20 M/uL    HGB 10.2 (L) 11.5 - 16.0 g/dL    HCT 31.6 (L) 35.0 - 47.0 %    MCV 86.8 80.0 - 99.0 FL    MCH 28.0 26.0 - 34.0 PG    MCHC 32.3 30.0 - 36.5 g/dL    RDW 16.8 (H) 11.5 - 14.5 %    PLATELET 109 (L) 150 - 400 K/uL    MPV 11.6 8.9 - 12.9 FL    NRBC 0.0 0 PER 100 WBC    ABSOLUTE NRBC 0.00 0.00 - 0.01 K/uL    NEUTROPHILS 59 32 - 75 %    LYMPHOCYTES 27 12 - 49 %    MONOCYTES 13 5 - 13 %    EOSINOPHILS 1 0 - 7 %    BASOPHILS 1 0 - 1 %    IMMATURE GRANULOCYTES 0 0.0 - 0.5 %    ABS. NEUTROPHILS 2.2 1.8 - 8.0 K/UL    ABS. LYMPHOCYTES 1.0 0.8 - 3.5 K/UL    ABS. MONOCYTES 0.5 0.0 - 1.0 K/UL    ABS. EOSINOPHILS 0.0 0.0 - 0.4 K/UL    ABS. BASOPHILS 0.0 0.0 - 0.1 K/UL    ABS. IMM. GRANS. 0.0 0.00 - 0.04 K/UL    DF AUTOMATED     METABOLIC PANEL, COMPREHENSIVE    Collection Time:  03/21/17 10:53 PM   Result Value Ref Range    Sodium 146 (H) 136 - 145 mmol/L    Potassium 3.5 3.5 - 5.1 mmol/L    Chloride 114 (H) 97 - 108 mmol/L    CO2 24 21 - 32 mmol/L    Anion gap 8 5 - 15 mmol/L    Glucose 102 (H) 65 - 100 mg/dL    BUN 28 (H) 6 - 20 MG/DL     Creatinine 1.06 (H) 0.55 - 1.02 MG/DL    BUN/Creatinine ratio 26 (H) 12 - 20      GFR est AA 60 (L) >60 ml/min/1.20m    GFR est non-AA 49 (L) >60 ml/min/1.760m   Calcium 9.5 8.5 - 10.1 MG/DL    Bilirubin, total 0.2 0.2 - 1.0 MG/DL    ALT (SGPT) 11 (L) 12 - 78 U/L    AST (SGOT) 18 15 - 37 U/L    Alk. phosphatase 95 45 - 117 U/L    Protein, total 9.4 (H) 6.4 - 8.2 g/dL    Albumin 2.9 (L) 3.5 - 5.0 g/dL    Globulin 6.5 (H) 2.0 - 4.0 g/dL    A-G Ratio 0.4 (L) 1.1 - 2.2     CK W/ REFLX CKMB    Collection Time: 03/21/17 10:53 PM   Result Value Ref Range    CK 41 26 - 192 U/L   TROPONIN I    Collection Time: 03/21/17 10:53 PM   Result Value Ref Range    Troponin-I, Qt. 0.26 (H) <0.05 ng/mL   URINALYSIS W/ REFLEX CULTURE    Collection Time: 03/22/17 12:26 AM   Result Value Ref Range    Color YELLOW/STRAW      Appearance CLOUDY (A) CLEAR      Specific gravity 1.013 1.003 - 1.030      pH (UA) 6.0 5.0 - 8.0      Protein 100 (A) NEG mg/dL    Glucose NEGATIVE  NEG mg/dL    Ketone NEGATIVE  NEG mg/dL    Bilirubin NEGATIVE  NEG      Blood LARGE (A) NEG      Urobilinogen 1.0 0.2 - 1.0 EU/dL    Nitrites NEGATIVE  NEG      Leukocyte Esterase LARGE (A) NEG      WBC >100 (H) 0 - 4 /hpf    RBC 20-50 0 - 5 /hpf    Epithelial cells FEW FEW /lpf    Bacteria NEGATIVE  NEG /hpf    UA:UC IF INDICATED URINE CULTURE ORDERED (A) CNI     TROPONIN I    Collection Time: 03/22/17  1:54 AM   Result Value Ref Range    Troponin-I, Qt. 0.26 (H) <0.05 ng/mL   EKG, 12 LEAD, INITIAL    Collection Time: 03/22/17  3:32 AM   Result Value Ref Range    Ventricular Rate 87 BPM    Atrial Rate 87 BPM    P-R Interval 156 ms    QRS Duration 72 ms    Q-T Interval 380 ms    QTC Calculation (Bezet) 457 ms    Calculated P Axis 39 degrees    Calculated R Axis 17 degrees    Calculated T Axis 125 degrees    Diagnosis       Sinus rhythm with premature atrial complexes  Voltage criteria for left ventricular hypertrophy  ST & T wave abnormality, consider lateral ischemia   When compared with ECG of  21-Mar-2017 22:49,  MANUAL COMPARISON REQUIRED, DATA IS UNCONFIRMED         Radiologic Studies -   XR SHOULDER LT AP/LAT MIN 2 V   Final Result      CT HEAD WO CONT   Final Result        CT Results  (Last 48 hours)               03/21/17 2321  CT HEAD WO CONT Final result    Impression:  IMPRESSION: Right parieto-occipital lobe encephalomalacia is compatible with old   infarct. Periventricular white matter disease likely represents chronic small   vessel ischemia. Old lacunar infarcts in the basal ganglia. No evidence of acute   infarct, intracranial hemorrhage, or intracranial mass.               Narrative:  EXAM:  CT HEAD WO CONT       INDICATION:   head injury       COMPARISON: None.       CONTRAST:  None.       TECHNIQUE: Unenhanced CT of the head was performed using 5 mm images. Brain and   bone windows were generated.  CT dose reduction was achieved through use of a   standardized protocol tailored for this examination and automatic exposure   control for dose modulation.         FINDINGS:   The ventricles and sulci are appropriate in size, shape and configuration and   midline. There is diffuse periventricular white matter disease. Encephalomalacia   is seen in the right parietal and occipital lobes. There are old lacunar   infarcts in the basal ganglia. There is no intracranial hemorrhage, extra-axial   collection, mass, mass effect or midline shift.  The basilar cisterns are open.   No acute infarct is identified. There is an old right parietal craniotomy   defect.. The visualized portions of the paranasal sinuses and mastoid air cells   are clear.               CXR Results  (Last 48 hours)    None        EXAM:  XR SHOULDER LT AP/LAT MIN 2 V  ??  INDICATION:   Trauma.  ??  COMPARISON: None.  ??  FINDINGS: Three views of the left shoulder demonstrate no fracture, dislocation  or other acute abnormality.  ??  IMPRESSION  IMPRESSION:  No acute abnormality.  ??  Medical Decision Making    I am the first provider for this patient.    I reviewed the vital signs, available nursing notes, past medical history, past surgical history, family history and social history.    Vital Signs-Reviewed the patient's vital signs.  Patient Vitals for the past 12 hrs:   Temp Pulse Resp BP SpO2   03/22/17 0456 - 80 22 (!) 166/98 100 %   03/22/17 0300 98.5 ??F (36.9 ??C) 88 18 143/84 98 %   03/22/17 0224 - 86 27 (!) 166/95 100 %   03/22/17 0200 - 91 22 (!) 180/101 99 %   03/22/17 0100 - 92 29 (!) 155/91 100 %   03/22/17 0002 - 93 26 (!) 154/95 99 %   03/21/17 2337 - 92 24 176/88 100 %   03/21/17 2330 - 93 30 - 100 %   03/21/17 2328 - - - (!) 179/150 -   03/21/17 2052 98.1 ??F (36.7 ??C) (!) 102 18 (!) 185/110 97 %  Pulse Oximetry Analysis - 100% on RA    Cardiac Monitor:   Rate: 95 bpm  Rhythm: sinus rhythm with arrhythmia with fusion complexes     EKG interpretation: (Preliminary) 2249  Rhythm: sinus rhythm with arrhythmia with fusion complexes ; and regular . Rate (approx.): 95; Axis: normal; PR interval: normal; QRS interval: 62 ms; ST/T wave: normal; Other findings: prolonged QT. No prior EKGs.  Written by Starr Lake, ED Scribe, as dictated by Loni Beckwith, MD.    EKG interpretation: (Repeat) 332  Rhythm: sinus rhythm with PACs ; and regular . Rate (approx.): 87; Axis: normal; PR interval: normal; QRS interval: normal; ST/T wave: normal.  Written by Starr Lake, ED Scribe, as dictated by Loni Beckwith, MD.    Records Reviewed: Nursing Notes, Old Medical Records, Previous electrocardiograms, Previous Radiology Studies and Previous Laboratory Studies    Provider Notes (Medical Decision Making):   Fall of unclear etiology; UTI, AKI, closed head injury, subdural hematoma; laceration, tetanus up to date    ED Course:   Initial assessment performed. The patients presenting problems have been discussed, and they are in agreement with the care plan formulated and  outlined with them.  I have encouraged them to ask questions as they arise throughout their visit.    Procedure Note - Laceration Repair:  2:00 AM  Procedure by Loni Beckwith, MD.  Complexity: Simple  3 cm linear laceration to L eye was irrigated copiously with NS under jet lavage, prepped with Hibiclens and draped in a sterile fashion.  The area was anesthetized with 2 mLs of  Lidocaine 1% without epinephrine via local infiltration.  The wound was explored with the following results: No foreign bodies found.  The wound was repaired with One layer suture closure: Skin Layer:  6 sutures placed, stitch type:simple interrupted, suture: 6-0 nylon..  The wound was closed with good hemostasis and approximation.  Sterile dressing applied.  Estimated blood loss: < 5 cc  The procedure took 1-15 minutes, and pt tolerated well.    Consult Note:  3:49 AM  Loni Beckwith, MD spoke with Dr. Henriette Combs  Specialty: Cardiology  Discussed pt???s hx, disposition, and available diagnostic and imaging results.  Reviewed care plans.  Consultant agrees with plans as outlined.  Recommends admission to hospital for observation since pt had fall of unclear etiology.     CONSULT NOTE:   4:15 AM  Loni Beckwith, MD spoke with Dr. Kaleen Odea,   Specialty: Hospitalist  Discussed pt's hx, disposition, and available diagnostic and imaging results. Reviewed care plans. Consultant will evaluate pt for admission.  Written by Starr Lake, ED Scribe, as dictated by Loni Beckwith, MD.    Critical Care Time:   0    Disposition:  5:03 AM  Patient is being admitted to the hospital. The results of their tests and reasons for their admission have been discussed with them and/or available family. They convey agreement and understanding for the need to be admitted and for their admission diagnosis. Consultation has been made with the inpatient physician specialist for hospitalization.     PLAN:  1. Admit to hospitalist.     Return to ED if worse     Diagnosis      Clinical Impression: No diagnosis found.    Attestations:  This note is prepared by Starr Lake, acting as Scribe for Loni Beckwith, MD.    Loni Beckwith, MD: The scribe's documentation has been prepared under my direction and personally reviewed by me in its entirety.  I confirm that the note above accurately reflects all work, treatment, procedures, and medical decision making performed by me.

## 2017-03-21 NOTE — ED Notes (Signed)
Bedside and Verbal shift change report given to Essence,RN (oncoming nurse) by Waynetta SandyAshley W,RN (offgoing nurse). Report included the following information SBAR, ED Summary, Intake/Output, MAR and Recent Results.  Monitor x 3. Call bell in reach. Bed in lowest position, with side rails up x 2. Pt updated on plan of care.

## 2017-03-21 NOTE — ED Notes (Signed)
Off going bedside report given to Essence, Charity fundraiserN.

## 2017-03-21 NOTE — ED Notes (Addendum)
[facility] called to give report on patient who arrived via EMS with a lac sustained to her forehead. According to the nurse, the patient fell coming out of her bathroom and hit her head on the floor. The lac is located above her left temple. They were unable to obtain vitals because the patient was crying and too upset.

## 2017-03-21 NOTE — ED Notes (Signed)
Patient taken to CT at this time.

## 2017-03-21 NOTE — ED Notes (Signed)
Patient returned from CT at this time.

## 2017-03-22 ENCOUNTER — Inpatient Hospital Stay
Admit: 2017-03-22 | Discharge: 2017-03-22 | Disposition: A | Discharge status: Skilled Nursing Facility | Payer: Medicare (Managed Care) | Attending: Emergency Medicine

## 2017-03-22 LAB — EKG 12-LEAD
Atrial Rate: 87 {beats}/min
Atrial Rate: 95 {beats}/min
P Axis: 28 degrees
P Axis: 39 degrees
P-R Interval: 156 ms
P-R Interval: 156 ms
Q-T Interval: 380 ms
Q-T Interval: 390 ms
QRS Duration: 62 ms
QRS Duration: 72 ms
QTc Calculation (Bazett): 457 ms
QTc Calculation (Bazett): 490 ms
R Axis: 16 degrees
R Axis: 17 degrees
T Axis: 125 degrees
T Axis: 56 degrees
Ventricular Rate: 87 {beats}/min
Ventricular Rate: 95 {beats}/min

## 2017-03-22 LAB — ECHOCARDIOGRAM COMPLETE 2D W DOPPLER W COLOR: Left Ventricular Ejection Fraction: 75

## 2017-03-22 LAB — GLUCOSE, POC
Glucose (POC): 91 mg/dL (ref 65–100)
Glucose (POC): 94 mg/dL (ref 65–100)

## 2017-03-22 LAB — CK W/ REFLX CKMB: CK: 41 U/L (ref 26–192)

## 2017-03-22 LAB — URINALYSIS W/ REFLEX CULTURE
Bacteria: NEGATIVE /hpf
Bilirubin: NEGATIVE
Glucose: NEGATIVE mg/dL
Ketone: NEGATIVE mg/dL
Nitrites: NEGATIVE
Protein: 100 mg/dL — AB
Specific gravity: 1.013 (ref 1.003–1.030)
Urobilinogen: 1 EU/dL (ref 0.2–1.0)
WBC: >100 /hpf — ABNORMAL HIGH (ref 0–4)
pH (UA): 6 (ref 5.0–8.0)

## 2017-03-22 LAB — TROPONIN I
Troponin-I, Qt.: 0.26 ng/mL — ABNORMAL HIGH (ref <0.05)
Troponin-I, Qt.: 0.26 ng/mL — ABNORMAL HIGH (ref <0.05)

## 2017-03-22 LAB — CBC WITH AUTOMATED DIFF
ABS. BASOPHILS: 0 10*3/uL (ref 0.0–0.1)
ABS. EOSINOPHILS: 0 10*3/uL (ref 0.0–0.4)
ABS. IMM. GRANS.: 0 10*3/uL (ref 0.00–0.04)
ABS. LYMPHOCYTES: 1 10*3/uL (ref 0.8–3.5)
ABS. MONOCYTES: 0.5 10*3/uL (ref 0.0–1.0)
ABS. NEUTROPHILS: 2.2 10*3/uL (ref 1.8–8.0)
ABSOLUTE NRBC: 0 10*3/uL (ref 0.00–0.01)
BASOPHILS: 1 % (ref 0–1)
EOSINOPHILS: 1 % (ref 0–7)
HCT: 31.6 % — ABNORMAL LOW (ref 35.0–47.0)
HGB: 10.2 g/dL — ABNORMAL LOW (ref 11.5–16.0)
IMMATURE GRANULOCYTES: 0 % (ref 0.0–0.5)
LYMPHOCYTES: 27 % (ref 12–49)
MCH: 28 PG (ref 26.0–34.0)
MCHC: 32.3 g/dL (ref 30.0–36.5)
MCV: 86.8 FL (ref 80.0–99.0)
MONOCYTES: 13 % (ref 5–13)
MPV: 11.6 FL (ref 8.9–12.9)
NEUTROPHILS: 59 % (ref 32–75)
NRBC: 0 PER 100 WBC
PLATELET: 109 10*3/uL — ABNORMAL LOW (ref 150–400)
RBC: 3.64 M/uL — ABNORMAL LOW (ref 3.80–5.20)
RDW: 16.8 % — ABNORMAL HIGH (ref 11.5–14.5)
WBC: 3.8 10*3/uL (ref 3.6–11.0)

## 2017-03-22 LAB — EKG, 12 LEAD, INITIAL
Atrial Rate: 87 {beats}/min
Atrial Rate: 95 {beats}/min
Calculated P Axis: 28 degrees
Calculated P Axis: 39 degrees
Calculated R Axis: 16 degrees
Calculated R Axis: 17 degrees
Calculated T Axis: 125 degrees
Calculated T Axis: 56 degrees
P-R Interval: 156 ms
P-R Interval: 156 ms
Q-T Interval: 380 ms
Q-T Interval: 390 ms
QRS Duration: 62 ms
QRS Duration: 72 ms
QTC Calculation (Bezet): 457 ms
QTC Calculation (Bezet): 490 ms
Ventricular Rate: 87 {beats}/min
Ventricular Rate: 95 {beats}/min

## 2017-03-22 LAB — METABOLIC PANEL, COMPREHENSIVE
A-G Ratio: 0.4 — ABNORMAL LOW (ref 1.1–2.2)
ALT (SGPT): 11 U/L — ABNORMAL LOW (ref 12–78)
AST (SGOT): 18 U/L (ref 15–37)
Albumin: 2.9 g/dL — ABNORMAL LOW (ref 3.5–5.0)
Alk. phosphatase: 95 U/L (ref 45–117)
Anion gap: 8 mmol/L (ref 5–15)
BUN/Creatinine ratio: 26 — ABNORMAL HIGH (ref 12–20)
BUN: 28 MG/DL — ABNORMAL HIGH (ref 6–20)
Bilirubin, total: 0.2 MG/DL (ref 0.2–1.0)
CO2: 24 mmol/L (ref 21–32)
Calcium: 9.5 MG/DL (ref 8.5–10.1)
Chloride: 114 mmol/L — ABNORMAL HIGH (ref 97–108)
Creatinine: 1.06 MG/DL — ABNORMAL HIGH (ref 0.55–1.02)
GFR est AA: 60 mL/min/{1.73_m2} — ABNORMAL LOW (ref >60)
GFR est non-AA: 49 mL/min/{1.73_m2} — ABNORMAL LOW (ref >60)
Globulin: 6.5 g/dL — ABNORMAL HIGH (ref 2.0–4.0)
Glucose: 102 mg/dL — ABNORMAL HIGH (ref 65–100)
Potassium: 3.5 mmol/L (ref 3.5–5.1)
Protein, total: 9.4 g/dL — ABNORMAL HIGH (ref 6.4–8.2)
Sodium: 146 mmol/L — ABNORMAL HIGH (ref 136–145)

## 2017-03-22 MED ORDER — .PHARMACY TO SUBSTITUTE PER PROTOCOL
Status: DC | PRN
Start: 2017-03-22 — End: 2017-03-22

## 2017-03-22 MED ORDER — MIRTAZAPINE 15 MG TAB
15 mg | Freq: Every evening | ORAL | Status: DC
Start: 2017-03-22 — End: 2017-03-24
  Administered 2017-03-23 – 2017-03-24 (×2): via ORAL

## 2017-03-22 MED ORDER — SODIUM CHLORIDE 0.9 % IJ SYRG
Freq: Three times a day (TID) | INTRAMUSCULAR | Status: DC
Start: 2017-03-22 — End: 2017-03-24
  Administered 2017-03-22 – 2017-03-24 (×7): via INTRAVENOUS

## 2017-03-22 MED ORDER — ADV ADDAPTOR
1 gram | Status: AC
Start: 2017-03-22 — End: 2017-03-24
  Administered 2017-03-23 – 2017-03-24 (×2): via INTRAVENOUS

## 2017-03-22 MED ORDER — MEMANTINE 5 MG TAB
5 mg | Freq: Two times a day (BID) | ORAL | Status: DC
Start: 2017-03-22 — End: 2017-03-24
  Administered 2017-03-22 – 2017-03-24 (×5): via ORAL

## 2017-03-22 MED ORDER — DOCUSATE SODIUM 100 MG CAP
100 mg | Freq: Every day | ORAL | Status: DC
Start: 2017-03-22 — End: 2017-03-24
  Administered 2017-03-22 – 2017-03-24 (×3): via ORAL

## 2017-03-22 MED ORDER — LEVOTHYROXINE 25 MCG TAB
25 mcg | Freq: Every day | ORAL | Status: DC
Start: 2017-03-22 — End: 2017-03-24
  Administered 2017-03-22 – 2017-03-24 (×3): via ORAL

## 2017-03-22 MED ORDER — ONDANSETRON (PF) 4 MG/2 ML INJECTION
4 mg/2 mL | Freq: Four times a day (QID) | INTRAMUSCULAR | Status: DC | PRN
Start: 2017-03-22 — End: 2017-03-24

## 2017-03-22 MED ORDER — SODIUM CHLORIDE 0.9 % IV
INTRAVENOUS | Status: DC
Start: 2017-03-22 — End: 2017-03-24
  Administered 2017-03-22 – 2017-03-24 (×5): via INTRAVENOUS

## 2017-03-22 MED ORDER — SODIUM CHLORIDE 0.9 % IJ SYRG
INTRAMUSCULAR | Status: DC | PRN
Start: 2017-03-22 — End: 2017-03-24

## 2017-03-22 MED ORDER — ACETAMINOPHEN 325 MG TABLET
325 mg | Freq: Four times a day (QID) | ORAL | Status: DC
Start: 2017-03-22 — End: 2017-03-24
  Administered 2017-03-22 – 2017-03-24 (×9): via ORAL

## 2017-03-22 MED ORDER — CEFTRIAXONE 1 GRAM SOLUTION FOR INJECTION
1 gram | INTRAMUSCULAR | Status: AC
Start: 2017-03-22 — End: 2017-03-22
  Administered 2017-03-22: 08:00:00 via INTRAVENOUS

## 2017-03-22 MED FILL — SODIUM CHLORIDE 0.9 % IV: INTRAVENOUS | Qty: 1000

## 2017-03-22 MED FILL — MEMANTINE 5 MG TAB: 5 mg | ORAL | Qty: 2

## 2017-03-22 MED FILL — DOK 100 MG CAPSULE: 100 mg | ORAL | Qty: 1

## 2017-03-22 MED FILL — LEVOTHYROXINE 25 MCG TAB: 25 mcg | ORAL | Qty: 1

## 2017-03-22 MED FILL — BD POSIFLUSH NORMAL SALINE 0.9 % INJECTION SYRINGE: INTRAMUSCULAR | Qty: 10

## 2017-03-22 MED FILL — MAPAP (ACETAMINOPHEN) 325 MG TABLET: 325 mg | ORAL | Qty: 2

## 2017-03-22 MED FILL — CEFTRIAXONE 1 GRAM SOLUTION FOR INJECTION: 1 gram | INTRAMUSCULAR | Qty: 1

## 2017-03-22 NOTE — Other (Signed)
TRANSFER - IN REPORT:    Verbal report received from [place], Charity fundraiserN (name) on Michele MacleodBarbara Osborne  being received from ED (unit) for routine progression of care      Report consisted of patient???s Situation, Background, Assessment and   Recommendations(SBAR).     Information from the following report(s) SBAR, ED Summary, Procedure Summary, Intake/Output and MAR was reviewed with the receiving nurse.    Opportunity for questions and clarification was provided.      Assessment completed upon patient???s arrival to unit and care assumed.

## 2017-03-22 NOTE — ED Notes (Addendum)
Orthostatics completed at this time. Patient unable to stand. Patient assisted into sitting position and unable to sit up on her own. Patient sleeping in between conversation. Patient following commands. Patient denies dizziness and lightheadedness with position change.     PureWick applied at this time.

## 2017-03-22 NOTE — Progress Notes (Signed)
[facility]iet[facility] called to provide patient with a meal tray.

## 2017-03-22 NOTE — Progress Notes (Signed)
Physical Therapy  Consult received.  Nursing requesting therapy be deferred today.  Will follow back tomorrow to initiate evaluation.  Thank you,  Nicholes StairsSheila R Salter, PT

## 2017-03-22 NOTE — Progress Notes (Signed)
Echo completed.

## 2017-03-22 NOTE — ED Notes (Signed)
Spoke with Nursing Supervisor about patient's bed placement at this time. Patient admitted for observation to a tele unit and has bed placement in PCU. RN wanted to make sure that patient meets qualifications for PCU bed.     RN Supervisior will call back with updates.

## 2017-03-22 NOTE — Consults (Signed)
Cardiology Consult Note    CC: none verbalized; Pt presented after fall    NLZ:Michele Michele Rolls, MD  Reason for consult:  Indeterminate troponin  Requesting MD:  Dr. Hulda Marin.  Admit Date: 03/21/2017   Today's Date: 03/22/2017      Cardiac Assessment/Plan:   Indeterminate troponin: Not c/w ACS; Echo pending; Rec med Rx only with marked dementia.  ASA/bblocker; ACEi/ARB as BP tolerates.    HTN: orthostatics pending.    Rhythm: sinus    Volume status: looked dry POA: IVF per primary team ongoing.    Dementia, ID, prior strokes (multiple on MRI 2011; ?prev ICH/SAH; prior right parietal craniotomy), hypothyroid, Dispo: all per primary team.     Hospital Problem List:  Active Problems:    Elevated troponin (03/22/2017)       ____________________________________________________________________  Michele Osborne is a 83 y.o. female presents with fall.    As noted in H&P: "83 y.o.  African American female brought in by EMS from [facility].  No family at bedside.  Patient oriented to self only and unable to provide history.  ??  According to EMS report, she fell while coming from bathroom, had lost balance.  Struck side of sink during fell and her glasses appear to have cut her.  EMS noted patient was on floor and bleeding from laceration controlled by nursing staff.  EMS cleared C-spine.  ??  In ER, found to have elevated troponin 0.26 and ER physician spoke to cardiology who recommended admission for observation.  Also diagnosed as uti and given rocephin.  Per ER physician "Family denies any mental status changes. Family states tetanus is up to date. Family denies any recent fever, chills, cough or congestion." "    ______________________________________________________________________    The patient can provide no history; no family is available currently    ECG: sinus; LVH with repolarization changes; no previous  Tele: sinus  CXR: not done     Notable labs: Hg 10.2; plt 109; +UA; Na 146; BUN 28; Cr 1.06. Neg CK x1; Trop 0.26 x2.    Notable prior cardiac history:  None known  ______________________________________________________________________  Patient Active Problem List    Diagnosis Date Noted   ??? Elevated troponin 03/22/2017        Past Medical History:   Diagnosis Date   ??? Dementia    ??? Endocrine disease     hypothyroid   ??? Hypertension    ??? Stroke ([place]) 1989      Past Surgical History:   Procedure Laterality Date   ??? HX GYN      fibroids     Allergies   Allergen Reactions   ??? Sulfa (Sulfonamide Antibiotics) Unknown (comments)      History reviewed. No pertinent family history.   Social History     Social History   ??? Marital status: WIDOWED     Spouse name: N/A   ??? Number of children: N/A   ??? Years of education: N/A     Occupational History   ??? Not on file.     Social History Main Topics   ??? Smoking status: Former Smoker   ??? Smokeless tobacco: Never Used      Comment: unsure   ??? Alcohol use No   ??? Drug use: Not on file   ??? Sexual activity: Not on file     Other Topics Concern   ??? Not on file     Social History Narrative   ???  No narrative on file     Current Facility-Administered Medications   Medication Dose Route Frequency   ??? sodium chloride (NS) flush 5-10 mL  5-10 mL IntraVENous Q8H   ??? sodium chloride (NS) flush 5-10 mL  5-10 mL IntraVENous PRN   ??? 0.9% sodium chloride infusion  100 mL/hr IntraVENous CONTINUOUS   ??? acetaminophen (TYLENOL) tablet 650 mg  650 mg Oral Q6H   ??? ondansetron (ZOFRAN) injection 4 mg  4 mg IntraVENous Q6H PRN   ??? [START ON 03/23/2017] cefTRIAXone (ROCEPHIN) 1 g in 0.9% sodium chloride (MBP/ADV) 50 mL  1 g IntraVENous Q24H   ??? docusate sodium (COLACE) capsule 100 mg  100 mg Oral DAILY   ??? levothyroxine (SYNTHROID) tablet 25 mcg  25 mcg Oral ACB   ??? memantine (NAMENDA) tablet 10 mg  10 mg Oral BID   ??? mirtazapine (REMERON) tablet 15 mg  15 mg Oral QHS     Current Outpatient Prescriptions   Medication Sig    ??? mirtazapine (REMERON SOL-TAB) 15 mg disintegrating tablet Take 15 mg by mouth nightly.   ??? memantine ER (NAMENDA XR) 28 mg capsule Take 28 mg by mouth daily.   ??? levothyroxine (SYNTHROID) 25 mcg tablet Take 25 mcg by mouth Daily (before breakfast).   ??? docusate sodium (COLACE) 100 mg capsule Take 100 mg by mouth daily.   ??? cranberry extract 450 mg tab tablet Take 425 mg by mouth daily.          Prior to Admission Medications:  Prior to Admission medications    Medication Sig Start Date End Date Taking? Authorizing Provider   mirtazapine (REMERON SOL-TAB) 15 mg disintegrating tablet Take 15 mg by mouth nightly.   Yes Phys Other, MD   memantine ER (NAMENDA XR) 28 mg capsule Take 28 mg by mouth daily.   Yes Phys Other, MD   levothyroxine (SYNTHROID) 25 mcg tablet Take 25 mcg by mouth Daily (before breakfast).   Yes Phys Other, MD   docusate sodium (COLACE) 100 mg capsule Take 100 mg by mouth daily.   Yes Phys Other, MD   cranberry extract 450 mg tab tablet Take 425 mg by mouth daily.   Yes Phys Other, MD        Review of Symptoms: She can provide no history due to dementia.     24 hr VS reviewed, overall VSSAF  Temp (24hrs), Avg:98.3 ??F (36.8 ??C), Min:98.1 ??F (36.7 ??C), Max:98.5 ??F (36.9 ??C)    Patient Vitals for the past 8 hrs:   Pulse   03/22/17 0700 80   03/22/17 0600 79   03/22/17 0456 80   03/22/17 0300 88   03/22/17 0224 86   03/22/17 0200 91   03/22/17 0100 92    Patient Vitals for the past 8 hrs:   Resp   03/22/17 0700 18   03/22/17 0600 14   03/22/17 0456 22   03/22/17 0300 18   03/22/17 0224 27   03/22/17 0200 22   03/22/17 0100 29    Patient Vitals for the past 8 hrs:   BP   03/22/17 0700 139/88   03/22/17 0600 142/85   03/22/17 0456 (!) 166/98   03/22/17 0300 143/84   03/22/17 0224 (!) 166/95   03/22/17 0200 (!) 180/101   03/22/17 0100 (!) 155/91        No intake or output data in the 24 hours ending 03/22/17 0826      Physical Exam (complete  single organ system exam)     Cons: The patient is no distress. Appears stated age. Elderly.  HEENT: Normal conjunctivae and palate. No xanthelasma. Head lac/sutured; minor ecchymosis/edema.  Neck: Flat JVP without appreciable HJR.  Resp: Normal respiratory effort with clear lungs bilaterally.   CV: Regular rate and rhythm.   PMI not palpated. Normal S1,S2  No gallop or rubs appreciated.  No murmur appreciated.  Intact carotid upstroke bilaterally without appreciated bruits.  Abdominal aorta not palpated; no abdominal bruit noted.  Intact pedal pulses.   No peripheral edema.  GI: No abd mass noted, soft; no organomegaly noted.  Bowel sounds present.   Muscular:  No significant kyphosis.  Strength WNL for age.  Ext: No cyanosis, clubbing, or stigmata of peripheral embolization.   Derm: No ulcers or stasis dermatitis of lower extremities.   Neuro: Lethargic; responds to voice but does not answer questions; Doesn't follow commands.       Labs:   Recent Results (from the past 24 hour(s))   EKG, 12 LEAD, INITIAL    Collection Time: 03/21/17 10:49 PM   Result Value Ref Range    Ventricular Rate 95 BPM    Atrial Rate 95 BPM    P-R Interval 156 ms    QRS Duration 62 ms    Q-T Interval 390 ms    QTC Calculation (Bezet) 490 ms    Calculated P Axis 28 degrees    Calculated R Axis 16 degrees    Calculated T Axis 56 degrees    Diagnosis       Sinus rhythm with sinus arrhythmia with fusion complexes  Marked ST abnormality, possible lateral subendocardial injury  Prolonged QT  No previous ECGs available     CBC WITH AUTOMATED DIFF    Collection Time: 03/21/17 10:53 PM   Result Value Ref Range    WBC 3.8 3.6 - 11.0 K/uL    RBC 3.64 (L) 3.80 - 5.20 M/uL    HGB 10.2 (L) 11.5 - 16.0 g/dL    HCT 31.6 (L) 35.0 - 47.0 %    MCV 86.8 80.0 - 99.0 FL    MCH 28.0 26.0 - 34.0 PG    MCHC 32.3 30.0 - 36.5 g/dL    RDW 16.8 (H) 11.5 - 14.5 %    PLATELET 109 (L) 150 - 400 K/uL    MPV 11.6 8.9 - 12.9 FL    NRBC 0.0 0 PER 100 WBC    ABSOLUTE NRBC 0.00 0.00 - 0.01 K/uL     NEUTROPHILS 59 32 - 75 %    LYMPHOCYTES 27 12 - 49 %    MONOCYTES 13 5 - 13 %    EOSINOPHILS 1 0 - 7 %    BASOPHILS 1 0 - 1 %    IMMATURE GRANULOCYTES 0 0.0 - 0.5 %    ABS. NEUTROPHILS 2.2 1.8 - 8.0 K/UL    ABS. LYMPHOCYTES 1.0 0.8 - 3.5 K/UL    ABS. MONOCYTES 0.5 0.0 - 1.0 K/UL    ABS. EOSINOPHILS 0.0 0.0 - 0.4 K/UL    ABS. BASOPHILS 0.0 0.0 - 0.1 K/UL    ABS. IMM. GRANS. 0.0 0.00 - 0.04 K/UL    DF AUTOMATED     METABOLIC PANEL, COMPREHENSIVE    Collection Time: 03/21/17 10:53 PM   Result Value Ref Range    Sodium 146 (H) 136 - 145 mmol/L    Potassium 3.5 3.5 - 5.1 mmol/L    Chloride 114 (H) 97 -  108 mmol/L    CO2 24 21 - 32 mmol/L    Anion gap 8 5 - 15 mmol/L    Glucose 102 (H) 65 - 100 mg/dL    BUN 28 (H) 6 - 20 MG/DL    Creatinine 1.06 (H) 0.55 - 1.02 MG/DL    BUN/Creatinine ratio 26 (H) 12 - 20      GFR est AA 60 (L) >60 ml/min/1.51m    GFR est non-AA 49 (L) >60 ml/min/1.763m   Calcium 9.5 8.5 - 10.1 MG/DL    Bilirubin, total 0.2 0.2 - 1.0 MG/DL    ALT (SGPT) 11 (L) 12 - 78 U/L    AST (SGOT) 18 15 - 37 U/L    Alk. phosphatase 95 45 - 117 U/L    Protein, total 9.4 (H) 6.4 - 8.2 g/dL    Albumin 2.9 (L) 3.5 - 5.0 g/dL    Globulin 6.5 (H) 2.0 - 4.0 g/dL    A-G Ratio 0.4 (L) 1.1 - 2.2     CK W/ REFLX CKMB    Collection Time: 03/21/17 10:53 PM   Result Value Ref Range    CK 41 26 - 192 U/L   TROPONIN I    Collection Time: 03/21/17 10:53 PM   Result Value Ref Range    Troponin-I, Qt. 0.26 (H) <0.05 ng/mL   URINALYSIS W/ REFLEX CULTURE    Collection Time: 03/22/17 12:26 AM   Result Value Ref Range    Color YELLOW/STRAW      Appearance CLOUDY (A) CLEAR      Specific gravity 1.013 1.003 - 1.030      pH (UA) 6.0 5.0 - 8.0      Protein 100 (A) NEG mg/dL    Glucose NEGATIVE  NEG mg/dL    Ketone NEGATIVE  NEG mg/dL    Bilirubin NEGATIVE  NEG      Blood LARGE (A) NEG      Urobilinogen 1.0 0.2 - 1.0 EU/dL    Nitrites NEGATIVE  NEG      Leukocyte Esterase LARGE (A) NEG      WBC >100 (H) 0 - 4 /hpf    RBC 20-50 0 - 5 /hpf     Epithelial cells FEW FEW /lpf    Bacteria NEGATIVE  NEG /hpf    UA:UC IF INDICATED URINE CULTURE ORDERED (A) CNI     TROPONIN I    Collection Time: 03/22/17  1:54 AM   Result Value Ref Range    Troponin-I, Qt. 0.26 (H) <0.05 ng/mL   EKG, 12 LEAD, INITIAL    Collection Time: 03/22/17  3:32 AM   Result Value Ref Range    Ventricular Rate 87 BPM    Atrial Rate 87 BPM    P-R Interval 156 ms    QRS Duration 72 ms    Q-T Interval 380 ms    QTC Calculation (Bezet) 457 ms    Calculated P Axis 39 degrees    Calculated R Axis 17 degrees    Calculated T Axis 125 degrees    Diagnosis       Sinus rhythm with premature atrial complexes  Voltage criteria for left ventricular hypertrophy  ST & T wave abnormality, consider lateral ischemia  When compared with ECG of 21-Mar-2017 22:49,  MANUAL COMPARISON REQUIRED, DATA IS UNCONFIRMED       Recent Labs      03/22/17   0154  03/21/17   2253   TROIQ  0.26*  0.26*       August Saucer, MD

## 2017-03-22 NOTE — Other (Signed)
Primary Nurse Carmelina PealAmanda C Carlton and Trenton Foundsorey Wallace, RN performed a dual skin assessment on this patient No impairment noted  Braden score is 13

## 2017-03-22 NOTE — Progress Notes (Addendum)
PCU SHIFT NURSING NOTE      Shift Summary:   [phone] Patient arrived to unit with GrenadaBrittany, RN at bedside. Assumed care of patient.  VSS. Drowsy but easy to awaken and oriented to self. Room air. Lungs bilaterally diminished but clear. NSR with PVC's. Incontinent of urine/stool. Purewick external catheter placed. Laceration to upper left lateral area of skull. Stitches in place with no drainage. Patient reports no pain.  1030 SCD therapy initiated.  1247 Paged Dr. Cyndie ChimeNguyen to get diet order restarted. Diet mechanical soft cardiac, with supplements approved by MD.  1450 Patient more alert but still confused which is her baseline. Daughter Meriam SpragueBeverly at bedside twice today indicates patient needs help with feeding. Poor PO intake.  1730 Did a set of orthostatic blood pressures to see if patient has low blood pressure when sitting, standing. Blood pressure actually went up substantially in both positions. Patient did report feeling dizzy while standing. Increase in blood pressure may be due to pain because patient did grimace and look uncomfortable.  1930 Bedside and Verbal shift change report given to Lafonda Mossesiana, Charity fundraiserN (oncoming nurse) by Marchelle FolksAmanda, RN (offgoing nurse). Report included the following information SBAR, Kardex, ED Summary, Intake/Output, MAR, Accordion and Recent Results.       Admission Date 03/21/2017   Admission Diagnosis Elevated troponin   Consults IP CONSULT TO CARDIOLOGY  IP CONSULT TO CARDIOLOGY        Consults   [x] PT   [x] OT   [] Speech   [] Case Management      []  Palliative      Cardiac Monitoring Order   [x] Yes   [] No     IV drips   [x] Yes    Drip:                NS             Dose:  100 ml/hr  Drip:                            Dose:  Drip:                            Dose:   [] No     GI Prophylaxis   [] Yes   [x] No         DVT Prophylaxis   SCDs:             Ted stockings:         []  Medication   [] Contraindicated   [] None      Activity Level           Purposeful Rounding every 1-2 hour?   [x] Yes    Schmidt Score  Total Score: 4   Bed Alarm (If score 3 or >)   [x] Yes   []  Refused (See signed refusal form in chart)   Braden Score      Braden Score (if score 14 or less)   [] PMT consult   [] Wound Care consult      [] Specialty bed   []  Nutrition consult          Needs prior to discharge:   Home O2 required:    [] Yes   [x] No    If yes, how much O2 required?    Other:    Last Bowel Movement:        Influenza Vaccine Received Flu Vaccine for Current Season (usually Sept-March): Not Flu Season  Pneumonia Vaccine           Diet Active Orders   There are no active orders of the following type(s): Diet.      LDAs               Peripheral IV 03/21/17 Right Antecubital (Active)   Site Assessment Clean, dry, & intact 03/21/2017 11:02 PM   Phlebitis Assessment 0 03/21/2017 11:02 PM   Infiltration Assessment 0 03/21/2017 11:02 PM   Dressing Status Clean, dry, & intact 03/21/2017 11:02 PM   Dressing Type Tape;Transparent 03/21/2017 11:02 PM   Hub Color/Line Status Pink;Flushed;Patent 03/21/2017 11:02 PM   Action Taken Blood drawn 03/21/2017 11:02 PM                      Urinary Catheter      Intake & Output   Date 03/21/17 0700 - 03/22/17 0659 03/22/17 0700 - 03/23/17 0659   Shift 0700-1859 1900-0659 24 Hour Total 0700-1859 1900-0659 24 Hour Total   I  N  T  A  K  E   P.O.    240  240      P.O.    240  240    Shift Total  (mL/kg)    240  (5.7)  240  (5.7)   O  U  T  P  U  T   Shift Total  (mL/kg)         NET    240  240   Weight (kg)  42.4 42.4 42.4 42.4 42.4         Readmission Risk Assessment Tool Score Low Risk            7       Total Score        2 Married. Living with Significant Other. Assisted Living. LTAC. SNF. or   Rehab    5 Pt. Coverage (Medicare=5 , Medicaid, or Self-Pay=4)        Criteria that do not apply:    Has Seen PCP in Last 6 Months (Yes=3, No=0)    Patient Length of Stay (>5 days = 3)    IP Visits Last 12 Months (1-3=4, 4=9, >4=11)    Charlson Comorbidity Score (Age + Comorbid Conditions)        Expected Length of Stay - - -   Actual Length of Stay 0

## 2017-03-22 NOTE — ED Notes (Signed)
MD Nguyen at bedside to evaluate patient.

## 2017-03-22 NOTE — H&P (Addendum)
Hospitalist Admission Note    NAME: Michele Osborne   DOB:  06-06-1930   MRN:  [id number]     Date/Time:  03/22/2017 7:19 AM    Patient PCP: Alinda Money, MD  ______________________________________________________________________   Assessment & Plan:  S/p fall coming out of bathroom and hitting side of head against sink  --unable to get history from patient due to dementia.  Per EMS, patient fell while coming out of bathroom, lost balance, struck side of sink when she fell and her glasses appeared to have cut her.  --CT head without acute process  --baseline per nursing home ambulate with assisted device and is a fall risk.    --consult pt/ot  --check orthostatic since she appears to be dry with elevated Na 146.  IVF with NS    Left temporal laceration, POA sutured in ER  --wound irrigated and sutured with 6 sutures of 6-0 nylon by ER.  Will need to be removed in 5 to 7 days.    Left shoulder pain s/p fall  --xray left shoulder without fracture or dislocation.  Tenderness to palpation and active ROM.   --put on scheduled tylenol 683m q6h    Pyuria  WBC>100 WBC, rule out uti  --no bacteria on UA.  No fever or leukocytosis.  Patient's symptom unreliable due to dementia.  Given rocephin for "UTI" by ER.  Has hx of frequent uti.  --will continue rocephin x 2 more doses.  Urine culture pending  --continue cranberry supplement    Elevated trop 0.26  --troponin at plateau 0.26 x 2.  CK normal.  --EKG with nonspecific ST-T changes suggestive of lateral ischemia.    --cardiology consulted.  Ordered echo    Hx stroke  Hx craniotomy by CT  --patient not on aspirin.  ?did she have prior hemorrhagic stroke or SDH?    Hypothyroid  --continue levothyroxine    Dementia  --continue remeron and namenda    There is no height or weight on file to calculate BMI.    Code: full per nursing home order  DVT prophylaxis: scd  Surrogate decision maker:  Daughter Michele Osborne [phone]       Subjective:    CHIEF COMPLAINT:  Face laceration s/p fall, left shoulder pain    HISTORY OF PRESENT ILLNESS:     Michele Osborne a 83y.o.  African American [facility]  No family at bedside.  Patient oriented to self only and unable to provide history.    According to EMS report, she fell while coming from bathroom, had lost balance.  Struck side of sink during fell and her glasses appear to have cut her.  EMS noted patient was on floor and bleeding from laceration controlled by nursing staff.  EMS cleared C-spine.    In ER, found to have elevated troponin 0.26 and ER physician spoke to cardiology who recommended admission for observation.  Also diagnosed as uti and given rocephin.  Per ER physician "Family denies any mental status changes. Family states tetanus is up to date. Family denies any recent fever, chills, cough or congestion."    We were asked to admit for work up and evaluation of the above problems.     Past Medical History:   Diagnosis Date   ??? Dementia    ??? Endocrine disease     hypothyroid   ??? Hypertension    ??? Stroke ([facility] 1989  Past Surgical History:   Procedure Laterality Date   ??? HX GYN      fibroids     Social History   Substance Use Topics   ??? Smoking status: Former Smoker   ??? Smokeless tobacco: Never Used      Comment: unsure   ??? Alcohol use No       History reviewed.Family history:  Unable to obtain due to patient's dementia  Allergies   Allergen Reactions   ??? Sulfa (Sulfonamide Antibiotics) Unknown (comments)        Prior to Admission medications    Medication Sig Start Date End Date Taking? Authorizing Provider   mirtazapine (REMERON SOL-TAB) 15 mg disintegrating tablet Take 15 mg by mouth nightly.   Yes Phys Other, MD   memantine ER (NAMENDA XR) 28 mg capsule Take 28 mg by mouth daily.   Yes Phys Other, MD   levothyroxine (SYNTHROID) 25 mcg tablet Take 25 mcg by mouth Daily (before breakfast).   Yes Phys Other, MD    docusate sodium (COLACE) 100 mg capsule Take 100 mg by mouth daily.   Yes Phys Other, MD   cranberry extract 450 mg tab tablet Take 425 mg by mouth daily.   Yes Phys Other, MD     REVIEW OF SYSTEMS:  POSITIVE= Bold.  Negative = normal text  Unable to obtain due to dementia.  She denies chest pain, SOB, neck pain.  +pain near left eye due to laceration    Objective:   VITALS:    Visit Vitals   ??? BP 139/88 (BP 1 Location: Right arm, BP Patient Position: At rest)   ??? Pulse 80   ??? Temp 98.4 ??F (36.9 ??C)   ??? Resp 18   ??? Wt 42.4 kg (93 lb 8 oz)   ??? SpO2 100%   ??? Breastfeeding No     Temp (24hrs), Avg:98.3 ??F (36.8 ??C), Min:98.1 ??F (36.7 ??C), Max:98.5 ??F (36.9 ??C)    There is no height or weight on file to calculate BMI.  PHYSICAL EXAM:    General:    Thin elderly female, sleeping, arousable to voice, remaining drowsy.  No distress, appears stated age.     HEENT: Atraumatic, anicteric sclerae, pink conjunctivae.  Sutures just to left of left eye with mild soft tissue   swelling.  Decreased left eye opening.  No oral ulcers, mucosa dry, throat clear.  Hearing intact.  Neck:  Supple, symmetrical,  thyroid: non tender  Lungs:   Clear to auscultation bilaterally.  No Wheezing or Rhonchi. No rales.  Chest wall:  No tenderness  No Accessory muscle use.  Heart:   Regular  rhythm,  No  murmur   No gallop.  No edema.    Abdomen:   Soft, non-tender. Not distended.  Bowel sounds normal. No masses.  Diaper in place  Extremities: No cyanosis.  No clubbing  Skin:     Not pale Not Jaundiced  No rashes   Psych:  Poor insight.  Not depressed.  Not anxious or agitated.  Neurologic: EOMs grossly intact.  No aphasia or slurred speech. Symmetrical strength arms 3/5, legs 3/5. Oriented self only    IMAGING RESULTS:   []        I have personally reviewed the actual   []      CXR  []      CT scan  CXR: none  CT head:  FINDINGS:  The ventricles and sulci are appropriate in size, shape and configuration and  midline. There is diffuse periventricular white matter disease. Encephalomalacia  is seen in the right parietal and occipital lobes. There are old lacunar  infarcts in the basal ganglia. There is no intracranial hemorrhage, extra-axial  collection, mass, mass effect or midline shift.  The basilar cisterns are open.  No acute infarct is identified. There is an old right parietal craniotomy  defect.. The visualized portions of the paranasal sinuses and mastoid air cells  are clear.  ??  IMPRESSION  IMPRESSION: Right parieto-occipital lobe encephalomalacia is compatible with old  infarct. Periventricular white matter disease likely represents chronic small  vessel ischemia. Old lacunar infarcts in the basal ganglia. No evidence of acute  infarct, intracranial hemorrhage, or intracranial mass.  EKG:  Sinus rhythm with nonspecific ST-T changes, LVH   ________________________________________________________________________  Care Plan discussed with:    Comments   Patient     Family      RN     Care Manager                    Consultant:      ________________________________________________________________________  Prophylaxis:  GI none   DVT SCD   ________________________________________________________________________  Recommended Disposition:   Home with Family    HH/PT/OT/RN    SNF/LTC y   [facility]    ________________________________________________________________________  Code Status:  Full Code y   DNR/DNI    ________________________________________________________________________  TOTAL TIME:  45 minutes      Comments    y Reviewed previous records     ______________________________________________________________________  Nolon Nations, MD      Procedures: see electronic medical records for all procedures/Xrays and details which were not copied into this note but were reviewed prior to creation of Plan.    LAB DATA REVIEWED:    Recent Results (from the past 24 hour(s))   EKG, 12 LEAD, INITIAL     Collection Time: 03/21/17 10:49 PM   Result Value Ref Range    Ventricular Rate 95 BPM    Atrial Rate 95 BPM    P-R Interval 156 ms    QRS Duration 62 ms    Q-T Interval 390 ms    QTC Calculation (Bezet) 490 ms    Calculated P Axis 28 degrees    Calculated R Axis 16 degrees    Calculated T Axis 56 degrees    Diagnosis       Sinus rhythm with sinus arrhythmia with fusion complexes  Marked ST abnormality, possible lateral subendocardial injury  Prolonged QT  No previous ECGs available     CBC WITH AUTOMATED DIFF    Collection Time: 03/21/17 10:53 PM   Result Value Ref Range    WBC 3.8 3.6 - 11.0 K/uL    RBC 3.64 (L) 3.80 - 5.20 M/uL    HGB 10.2 (L) 11.5 - 16.0 g/dL    HCT 31.6 (L) 35.0 - 47.0 %    MCV 86.8 80.0 - 99.0 FL    MCH 28.0 26.0 - 34.0 PG    MCHC 32.3 30.0 - 36.5 g/dL    RDW 16.8 (H) 11.5 - 14.5 %    PLATELET 109 (L) 150 - 400 K/uL    MPV 11.6 8.9 - 12.9 FL    NRBC 0.0 0 PER 100 WBC    ABSOLUTE NRBC 0.00 0.00 - 0.01 K/uL    NEUTROPHILS 59 32 - 75 %    LYMPHOCYTES 27 12 - 49 %    MONOCYTES 13 5 - 13 %  EOSINOPHILS 1 0 - 7 %    BASOPHILS 1 0 - 1 %    IMMATURE GRANULOCYTES 0 0.0 - 0.5 %    ABS. NEUTROPHILS 2.2 1.8 - 8.0 K/UL    ABS. LYMPHOCYTES 1.0 0.8 - 3.5 K/UL    ABS. MONOCYTES 0.5 0.0 - 1.0 K/UL    ABS. EOSINOPHILS 0.0 0.0 - 0.4 K/UL    ABS. BASOPHILS 0.0 0.0 - 0.1 K/UL    ABS. IMM. GRANS. 0.0 0.00 - 0.04 K/UL    DF AUTOMATED     METABOLIC PANEL, COMPREHENSIVE    Collection Time: 03/21/17 10:53 PM   Result Value Ref Range    Sodium 146 (H) 136 - 145 mmol/L    Potassium 3.5 3.5 - 5.1 mmol/L    Chloride 114 (H) 97 - 108 mmol/L    CO2 24 21 - 32 mmol/L    Anion gap 8 5 - 15 mmol/L    Glucose 102 (H) 65 - 100 mg/dL    BUN 28 (H) 6 - 20 MG/DL    Creatinine 1.06 (H) 0.55 - 1.02 MG/DL    BUN/Creatinine ratio 26 (H) 12 - 20      GFR est AA 60 (L) >60 ml/min/1.55m    GFR est non-AA 49 (L) >60 ml/min/1.790m   Calcium 9.5 8.5 - 10.1 MG/DL    Bilirubin, total 0.2 0.2 - 1.0 MG/DL    ALT (SGPT) 11 (L) 12 - 78 U/L     AST (SGOT) 18 15 - 37 U/L    Alk. phosphatase 95 45 - 117 U/L    Protein, total 9.4 (H) 6.4 - 8.2 g/dL    Albumin 2.9 (L) 3.5 - 5.0 g/dL    Globulin 6.5 (H) 2.0 - 4.0 g/dL    A-G Ratio 0.4 (L) 1.1 - 2.2     CK W/ REFLX CKMB    Collection Time: 03/21/17 10:53 PM   Result Value Ref Range    CK 41 26 - 192 U/L   TROPONIN I    Collection Time: 03/21/17 10:53 PM   Result Value Ref Range    Troponin-I, Qt. 0.26 (H) <0.05 ng/mL   URINALYSIS W/ REFLEX CULTURE    Collection Time: 03/22/17 12:26 AM   Result Value Ref Range    Color YELLOW/STRAW      Appearance CLOUDY (A) CLEAR      Specific gravity 1.013 1.003 - 1.030      pH (UA) 6.0 5.0 - 8.0      Protein 100 (A) NEG mg/dL    Glucose NEGATIVE  NEG mg/dL    Ketone NEGATIVE  NEG mg/dL    Bilirubin NEGATIVE  NEG      Blood LARGE (A) NEG      Urobilinogen 1.0 0.2 - 1.0 EU/dL    Nitrites NEGATIVE  NEG      Leukocyte Esterase LARGE (A) NEG      WBC >100 (H) 0 - 4 /hpf    RBC 20-50 0 - 5 /hpf    Epithelial cells FEW FEW /lpf    Bacteria NEGATIVE  NEG /hpf    UA:UC IF INDICATED URINE CULTURE ORDERED (A) CNI     TROPONIN I    Collection Time: 03/22/17  1:54 AM   Result Value Ref Range    Troponin-I, Qt. 0.26 (H) <0.05 ng/mL   EKG, 12 LEAD, INITIAL    Collection Time: 03/22/17  3:32 AM   Result Value Ref Range  Ventricular Rate 87 BPM    Atrial Rate 87 BPM    P-R Interval 156 ms    QRS Duration 72 ms    Q-T Interval 380 ms    QTC Calculation (Bezet) 457 ms    Calculated P Axis 39 degrees    Calculated R Axis 17 degrees    Calculated T Axis 125 degrees    Diagnosis       Sinus rhythm with premature atrial complexes  Voltage criteria for left ventricular hypertrophy  ST & T wave abnormality, consider lateral ischemia  When compared with ECG of 21-Mar-2017 22:49,  MANUAL COMPARISON REQUIRED, DATA IS UNCONFIRMED

## 2017-03-22 NOTE — ED Notes (Signed)
Bedside and Verbal shift change report given to GrenadaBrittany M,RN (oncoming nurse) by Starr SinclairEssence,RN (offgoing nurse). Report included the following information SBAR, ED Summary, Intake/Output, MAR and Recent Results.  Monitor x 3. Call bell in reach. Bed in lowest position, with side rails up x 2. Pt updated on plan of care. Appears to be sleeping at this time.

## 2017-03-22 NOTE — Progress Notes (Signed)
OT order received, chart reviewed in an attempt to see patient for OT evaluation and noted PT had attempted to see patient for evaluation, but evaluation deferred for today per request of nursing. OT will defer and attempt follow back for evaluation tomorrow or Sunday.

## 2017-03-22 NOTE — ED Notes (Addendum)
TRANSFER - OUT REPORT:    Verbal report given to Marchelle FolksAmanda, RN (name) on Gwynneth MacleodBarbara Saladin  being transferred to PCU (unit) for routine progression of care       Report consisted of patient???s Situation, Background, Assessment and   Recommendations(SBAR).     Information from the following report(s) SBAR, Kardex, ED Summary, Intake/Output, MAR, Recent Results, Med Rec Status and Cardiac Rhythm NSR was reviewed with the receiving nurse.    Lines:   Peripheral IV 03/21/17 Right Antecubital (Active)   Site Assessment Clean, dry, & intact 03/21/2017 11:02 PM   Phlebitis Assessment 0 03/21/2017 11:02 PM   Infiltration Assessment 0 03/21/2017 11:02 PM   Dressing Status Clean, dry, & intact 03/21/2017 11:02 PM   Dressing Type Tape;Transparent 03/21/2017 11:02 PM   Hub Color/Line Status Pink;Flushed;Patent 03/21/2017 11:02 PM   Action Taken Blood drawn 03/21/2017 11:02 PM        Opportunity for questions and clarification was provided.      Patient transported with:   Monitor   & Registered Nurse

## 2017-03-22 NOTE — ED Notes (Signed)
Assumed care of patient from Essence O, RN. Patient sleeping comfortably on stretcher with call bell within reach. Patient's belongings at bedside. Side rails x2. Patient on the monitor x3 at this time. Patient has on nonskid socks and fall band applied at this time. No further complaints noted.

## 2017-03-22 NOTE — Progress Notes (Signed)
PCU SHIFT NURSING NOTE      Bedside and Verbal shift change report given to Marchelle FolksAmanda (Cabin crewoncoming nurse) by Lafonda Mossesiana (offgoing nurse). Report included the following information SBAR, Kardex, MAR, Recent Results and Med Rec Status.        Admission Date 03/21/2017   Admission Diagnosis Elevated troponin   Consults IP CONSULT TO CARDIOLOGY  IP CONSULT TO CARDIOLOGY        Consults   [] PT   [] OT   [] Speech   [] Case Management      []  Palliative      Cardiac Monitoring Order   [x] Yes   [] No     IV drips   [] Yes    Drip:                            Dose:  Drip:                            Dose:  Drip:                            Dose:   [x] No     GI Prophylaxis   [] Yes   [] No         DVT Prophylaxis   SCDs:  Sequential Compression Device: Bilateral          Ted stockings:         []  Medication   [] Contraindicated   [] None      Activity Level Activity Level: Bed Rest     Activity Assistance: Other (comment) (Needs mobility evaluation)   Purposeful Rounding every 1-2 hour?   [x] Yes   Noreene FilbertSchmidt Score  Total Score: 4   Bed Alarm (If score 3 or >)   [] Yes   []  Refused (See signed refusal form in chart)   Braden Score  Braden Score: 14   Braden Score (if score 14 or less)   [] PMT consult   [] Wound Care consult      [] Specialty bed   []  Nutrition consult          Needs prior to discharge:   Home O2 required:    [] Yes   [] No    If yes, how much O2 required?    Other:    Last Bowel Movement: Last Bowel Movement Date:  (Patient not able to verbalize)      Influenza Vaccine Received Flu Vaccine for Current Season (usually Sept-March): Not Flu Season        Pneumonia Vaccine           Diet Active Orders   Diet    DIET CARDIAC Mechanical Soft; Bland      LDAs               Peripheral IV 03/23/17 Left Antecubital (Active)   Site Assessment Clean, dry, & intact 03/23/2017  3:10 AM   Phlebitis Assessment 0 03/23/2017  3:10 AM   Infiltration Assessment 0 03/23/2017  3:10 AM   Dressing Status Clean, dry, & intact 03/23/2017  3:10 AM    Dressing Type Transparent 03/23/2017  3:10 AM   Hub Color/Line Status Blue 03/23/2017  3:10 AM          External Female Catheter 03/22/17 (Active)   Site Assessment Clean, dry, & intact 03/22/2017  7:11 PM   Repositioned Yes 03/22/2017  7:11 PM   Perineal Care  No 03/22/2017  7:11 PM   Wick Changed Yes 03/22/2017  7:11 PM   Suction Canister/Tubing Changed No 03/22/2017  7:11 PM   Urine Output (mL) 900 ml 03/23/2017  6:34 AM                Urinary Catheter      Intake & Output   Date 03/22/17 0700 - 03/23/17 0659 03/23/17 0700 - 03/24/17 0659   Shift 0700-1859 1900-0659 24 Hour Total 0700-1859 1900-0659 24 Hour Total   I  N  T  A  K  E   P.O. 360  360         P.O. 360  360       I.V.  (mL/kg/hr) 640  (1.3)  640  (0.6)         Volume (0.9% sodium chloride infusion) 640  640       Shift Total  (mL/kg) 1000  (23.6)  1000  (23.6)      O  U  T  P  U  T   Urine  (mL/kg/hr) 250  (0.5) 1075  (2.1) 1325  (1.3)         Urine Output (mL) (External Female Catheter 03/22/17) 250 1075 1325       Shift Total  (mL/kg) 250  (5.9) 1075  (25.3) 1325  (31.2)      NET 750 -1075 -325      Weight (kg) 42.4 42.4 42.4 42.4 42.4 42.4         Readmission Risk Assessment Tool Score Low Risk            12       Total Score        2 Married. Living with Significant Other. Assisted Living. LTAC. SNF. or   Rehab    5 Pt. Coverage (Medicare=5 , Medicaid, or Self-Pay=4)    5 Charlson Comorbidity Score (Age + Comorbid Conditions)        Criteria that do not apply:    Has Seen PCP in Last 6 Months (Yes=3, No=0)    Patient Length of Stay (>5 days = 3)    IP Visits Last 12 Months (1-3=4, 4=9, >4=11)       Expected Length of Stay - - -   Actual Length of Stay 0

## 2017-03-22 NOTE — Consults (Signed)
Cardiology Consult Note    CC: none verbalized; Pt presented after fall    DGL:OVFIE Michele Rolls, MD  Reason for consult:  Indeterminate troponin  Requesting MD:  Dr. Hulda Marin.  Admit Date: 03/21/2017   Today's Date: 03/22/2017      Cardiac Assessment/Plan:   Indeterminate troponin: Not c/w ACS; Echo pending; Rec med Rx only with marked dementia.  ASA/bblocker; ACEi/ARB as BP tolerates.    HTN: orthostatics pending.    Rhythm: sinus    Volume status: looked dry POA: IVF per primary team ongoing.    Dementia, ID, prior strokes (multiple on MRI 2011; ?prev ICH/SAH; prior right parietal craniotomy), hypothyroid, Dispo: all per primary team.     Hospital Problem List:  Active Problems:    Elevated troponin (03/22/2017)       ____________________________________________________________________  Michele Osborne is a 83 y.o. female presents with fall.    As noted in H&P: "82 y.o.  African American female brought in by EMS from [facility] and [place] home.  No family at bedside.  Patient oriented to self only and unable to provide history.  ??  According to EMS report, she fell while coming from bathroom, had lost balance.  Struck side of sink during fell and her glasses appear to have cut her.  EMS noted patient was on floor and bleeding from laceration controlled by nursing staff.  EMS cleared C-spine.  ??  In ER, found to have elevated troponin 0.26 and ER physician spoke to cardiology who recommended admission for observation.  Also diagnosed as uti and given rocephin.  Per ER physician "Family denies any mental status changes. Family states tetanus is up to date. Family denies any recent fever, chills, cough or congestion." "    ______________________________________________________________________    The patient can provide no history; no family is available currently    ECG: sinus; LVH with repolarization changes; no previous  Tele: sinus  CXR: not done    Notable labs: Hg 10.2; plt 109; +UA; Na 146; BUN 28; Cr  1.06. Neg CK x1; Trop 0.26 x2.    Notable prior cardiac history:  None known  ______________________________________________________________________  Patient Active Problem List    Diagnosis Date Noted   ??? Elevated troponin 03/22/2017        Past Medical History:   Diagnosis Date   ??? Dementia    ??? Endocrine disease     hypothyroid   ??? Hypertension    ??? Stroke ([place]) 1989      Past Surgical History:   Procedure Laterality Date   ??? HX GYN      fibroids     Allergies   Allergen Reactions   ??? Sulfa (Sulfonamide Antibiotics) Unknown (comments)      History reviewed. No pertinent family history.   Social History     Social History   ??? Marital status: WIDOWED     Spouse name: Michele Osborne   ??? Number of children: Michele Osborne   ??? Years of education: Michele Osborne     Occupational History   ??? Not on file.     Social History Main Topics   ??? Smoking status: Former Smoker   ??? Smokeless tobacco: Never Used      Comment: unsure   ??? Alcohol use No   ??? Drug use: Not on file   ??? Sexual activity: Not on file     Other Topics Concern   ??? Not on file     Social History Narrative   ???  No narrative on file     Current Facility-Administered Medications   Medication Dose Route Frequency   ??? sodium chloride (NS) flush 5-10 mL  5-10 mL IntraVENous Q8H   ??? sodium chloride (NS) flush 5-10 mL  5-10 mL IntraVENous PRN   ??? 0.9% sodium chloride infusion  100 mL/hr IntraVENous CONTINUOUS   ??? acetaminophen (TYLENOL) tablet 650 mg  650 mg Oral Q6H   ??? ondansetron (ZOFRAN) injection 4 mg  4 mg IntraVENous Q6H PRN   ??? [START ON 03/23/2017] cefTRIAXone (ROCEPHIN) 1 g in 0.9% sodium chloride (MBP/ADV) 50 mL  1 g IntraVENous Q24H   ??? docusate sodium (COLACE) capsule 100 mg  100 mg Oral DAILY   ??? levothyroxine (SYNTHROID) tablet 25 mcg  25 mcg Oral ACB   ??? memantine (NAMENDA) tablet 10 mg  10 mg Oral BID   ??? mirtazapine (REMERON) tablet 15 mg  15 mg Oral QHS     Current Outpatient Prescriptions   Medication Sig   ??? mirtazapine (REMERON SOL-TAB) 15 mg disintegrating tablet Take 15 mg by  mouth nightly.   ??? memantine ER (NAMENDA XR) 28 mg capsule Take 28 mg by mouth daily.   ??? levothyroxine (SYNTHROID) 25 mcg tablet Take 25 mcg by mouth Daily (before breakfast).   ??? docusate sodium (COLACE) 100 mg capsule Take 100 mg by mouth daily.   ??? cranberry extract 450 mg tab tablet Take 425 mg by mouth daily.          Prior to Admission Medications:  Prior to Admission medications    Medication Sig Start Date End Date Taking? Authorizing Provider   mirtazapine (REMERON SOL-TAB) 15 mg disintegrating tablet Take 15 mg by mouth nightly.   Yes Phys Other, MD   memantine ER (NAMENDA XR) 28 mg capsule Take 28 mg by mouth daily.   Yes Phys Other, MD   levothyroxine (SYNTHROID) 25 mcg tablet Take 25 mcg by mouth Daily (before breakfast).   Yes Phys Other, MD   docusate sodium (COLACE) 100 mg capsule Take 100 mg by mouth daily.   Yes Phys Other, MD   cranberry extract 450 mg tab tablet Take 425 mg by mouth daily.   Yes Phys Other, MD        Review of Symptoms: She can provide no history due to dementia.     24 hr VS reviewed, overall VSSAF  Temp (24hrs), Avg:98.3 ??F (36.8 ??C), Min:98.1 ??F (36.7 ??C), Max:98.5 ??F (36.9 ??C)    Patient Vitals for the past 8 hrs:   Pulse   03/22/17 0700 80   03/22/17 0600 79   03/22/17 0456 80   03/22/17 0300 88   03/22/17 0224 86   03/22/17 0200 91   03/22/17 0100 92    Patient Vitals for the past 8 hrs:   Resp   03/22/17 0700 18   03/22/17 0600 14   03/22/17 0456 22   03/22/17 0300 18   03/22/17 0224 27   03/22/17 0200 22   03/22/17 0100 29    Patient Vitals for the past 8 hrs:   BP   03/22/17 0700 139/88   03/22/17 0600 142/85   03/22/17 0456 (!) 166/98   03/22/17 0300 143/84   03/22/17 0224 (!) 166/95   03/22/17 0200 (!) 180/101   03/22/17 0100 (!) 155/91        No intake or output data in the 24 hours ending 03/22/17 0826      Physical Exam (complete  single organ system exam)    Cons: The patient is no distress. Appears stated age. Elderly.  HEENT: Normal conjunctivae and palate. No  xanthelasma. Head lac/sutured; minor ecchymosis/edema.  Neck: Flat JVP without appreciable HJR.  Resp: Normal respiratory effort with clear lungs bilaterally.   CV: Regular rate and rhythm.   PMI not palpated. Normal S1,S2  No gallop or rubs appreciated.  No murmur appreciated.  Intact carotid upstroke bilaterally without appreciated bruits.  Abdominal aorta not palpated; no abdominal bruit noted.  Intact pedal pulses.   No peripheral edema.  GI: No abd mass noted, soft; no organomegaly noted.  Bowel sounds present.   Muscular:  No significant kyphosis.  Strength WNL for age.  Ext: No cyanosis, clubbing, or stigmata of peripheral embolization.   Derm: No ulcers or stasis dermatitis of lower extremities.   Neuro: Lethargic; responds to voice but does not answer questions; Doesn't follow commands.       Labs:   Recent Results (from the past 24 hour(s))   EKG, 12 LEAD, INITIAL    Collection Time: 03/21/17 10:49 PM   Result Value Ref Range    Ventricular Rate 95 BPM    Atrial Rate 95 BPM    P-R Interval 156 ms    QRS Duration 62 ms    Q-T Interval 390 ms    QTC Calculation (Bezet) 490 ms    Calculated P Axis 28 degrees    Calculated R Axis 16 degrees    Calculated T Axis 56 degrees    Diagnosis       Sinus rhythm with sinus arrhythmia with fusion complexes  Marked ST abnormality, possible lateral subendocardial injury  Prolonged QT  No previous ECGs available     CBC WITH AUTOMATED DIFF    Collection Time: 03/21/17 10:53 PM   Result Value Ref Range    WBC 3.8 3.6 - 11.0 K/uL    RBC 3.64 (L) 3.80 - 5.20 M/uL    HGB 10.2 (L) 11.5 - 16.0 g/dL    HCT 31.6 (L) 35.0 - 47.0 %    MCV 86.8 80.0 - 99.0 FL    MCH 28.0 26.0 - 34.0 PG    MCHC 32.3 30.0 - 36.5 g/dL    RDW 16.8 (H) 11.5 - 14.5 %    PLATELET 109 (L) 150 - 400 K/uL    MPV 11.6 8.9 - 12.9 FL    NRBC 0.0 0 PER 100 WBC    ABSOLUTE NRBC 0.00 0.00 - 0.01 K/uL    NEUTROPHILS 59 32 - 75 %    LYMPHOCYTES 27 12 - 49 %    MONOCYTES 13 5 - 13 %    EOSINOPHILS 1 0 - 7 %     BASOPHILS 1 0 - 1 %    IMMATURE GRANULOCYTES 0 0.0 - 0.5 %    ABS. NEUTROPHILS 2.2 1.8 - 8.0 K/UL    ABS. LYMPHOCYTES 1.0 0.8 - 3.5 K/UL    ABS. MONOCYTES 0.5 0.0 - 1.0 K/UL    ABS. EOSINOPHILS 0.0 0.0 - 0.4 K/UL    ABS. BASOPHILS 0.0 0.0 - 0.1 K/UL    ABS. IMM. GRANS. 0.0 0.00 - 0.04 K/UL    DF AUTOMATED     METABOLIC PANEL, COMPREHENSIVE    Collection Time: 03/21/17 10:53 PM   Result Value Ref Range    Sodium 146 (H) 136 - 145 mmol/L    Potassium 3.5 3.5 - 5.1 mmol/L    Chloride 114 (H) 97 -  108 mmol/L    CO2 24 21 - 32 mmol/L    Anion gap 8 5 - 15 mmol/L    Glucose 102 (H) 65 - 100 mg/dL    BUN 28 (H) 6 - 20 MG/DL    Creatinine 1.06 (H) 0.55 - 1.02 MG/DL    BUN/Creatinine ratio 26 (H) 12 - 20      GFR est AA 60 (L) >60 ml/min/1.70m    GFR est non-AA 49 (L) >60 ml/min/1.772m   Calcium 9.5 8.5 - 10.1 MG/DL    Bilirubin, total 0.2 0.2 - 1.0 MG/DL    ALT (SGPT) 11 (L) 12 - 78 U/L    AST (SGOT) 18 15 - 37 U/L    Alk. phosphatase 95 45 - 117 U/L    Protein, total 9.4 (H) 6.4 - 8.2 g/dL    Albumin 2.9 (L) 3.5 - 5.0 g/dL    Globulin 6.5 (H) 2.0 - 4.0 g/dL    A-G Ratio 0.4 (L) 1.1 - 2.2     CK W/ REFLX CKMB    Collection Time: 03/21/17 10:53 PM   Result Value Ref Range    CK 41 26 - 192 U/L   TROPONIN I    Collection Time: 03/21/17 10:53 PM   Result Value Ref Range    Troponin-I, Qt. 0.26 (H) <0.05 ng/mL   URINALYSIS W/ REFLEX CULTURE    Collection Time: 03/22/17 12:26 AM   Result Value Ref Range    Color YELLOW/STRAW      Appearance CLOUDY (A) CLEAR      Specific gravity 1.013 1.003 - 1.030      pH (UA) 6.0 5.0 - 8.0      Protein 100 (A) NEG mg/dL    Glucose NEGATIVE  NEG mg/dL    Ketone NEGATIVE  NEG mg/dL    Bilirubin NEGATIVE  NEG      Blood LARGE (A) NEG      Urobilinogen 1.0 0.2 - 1.0 EU/dL    Nitrites NEGATIVE  NEG      Leukocyte Esterase LARGE (A) NEG      WBC >100 (H) 0 - 4 /hpf    RBC 20-50 0 - 5 /hpf    Epithelial cells FEW FEW /lpf    Bacteria NEGATIVE  NEG /hpf    UA:UC IF INDICATED URINE CULTURE ORDERED  (A) CNI     TROPONIN I    Collection Time: 03/22/17  1:54 AM   Result Value Ref Range    Troponin-I, Qt. 0.26 (H) <0.05 ng/mL   EKG, 12 LEAD, INITIAL    Collection Time: 03/22/17  3:32 AM   Result Value Ref Range    Ventricular Rate 87 BPM    Atrial Rate 87 BPM    P-R Interval 156 ms    QRS Duration 72 ms    Q-T Interval 380 ms    QTC Calculation (Bezet) 457 ms    Calculated P Axis 39 degrees    Calculated R Axis 17 degrees    Calculated T Axis 125 degrees    Diagnosis       Sinus rhythm with premature atrial complexes  Voltage criteria for left ventricular hypertrophy  ST & T wave abnormality, consider lateral ischemia  When compared with ECG of 21-Mar-2017 22:49,  MANUAL COMPARISON REQUIRED, DATA IS UNCONFIRMED       Recent Labs      03/22/17   0154  03/21/17   2253   TROIQ  0.26*  0.26*       August Saucer, MD

## 2017-03-23 ENCOUNTER — Observation Stay

## 2017-03-23 ENCOUNTER — Observation Stay: Admit: 2017-03-23 | Payer: Medicare (Managed Care) | Primary: Internal Medicine

## 2017-03-23 LAB — CBC WITH AUTOMATED DIFF
ABS. BASOPHILS: 0 10*3/uL (ref 0.0–0.1)
ABS. EOSINOPHILS: 0.1 10*3/uL (ref 0.0–0.4)
ABS. IMM. GRANS.: 0 10*3/uL (ref 0.00–0.04)
ABS. LYMPHOCYTES: 1.4 10*3/uL (ref 0.8–3.5)
ABS. MONOCYTES: 0.3 10*3/uL (ref 0.0–1.0)
ABS. NEUTROPHILS: 1.2 10*3/uL — ABNORMAL LOW (ref 1.8–8.0)
ABSOLUTE NRBC: 0 10*3/uL (ref 0.00–0.01)
BASOPHILS: 0 % (ref 0–1)
EOSINOPHILS: 2 % (ref 0–7)
HCT: 31.2 % — ABNORMAL LOW (ref 35.0–47.0)
HGB: 9.6 g/dL — ABNORMAL LOW (ref 11.5–16.0)
IMMATURE GRANULOCYTES: 0 % (ref 0.0–0.5)
LYMPHOCYTES: 48 % (ref 12–49)
MCH: 27 PG (ref 26.0–34.0)
MCHC: 30.8 g/dL (ref 30.0–36.5)
MCV: 87.6 FL (ref 80.0–99.0)
MONOCYTES: 11 % (ref 5–13)
MPV: 11 FL (ref 8.9–12.9)
NEUTROPHILS: 39 % (ref 32–75)
NRBC: 0 PER 100 WBC
PLATELET: 103 10*3/uL — ABNORMAL LOW (ref 150–400)
RBC: 3.56 M/uL — ABNORMAL LOW (ref 3.80–5.20)
RDW: 16.8 % — ABNORMAL HIGH (ref 11.5–14.5)
WBC: 3 10*3/uL — ABNORMAL LOW (ref 3.6–11.0)

## 2017-03-23 LAB — METABOLIC PANEL, BASIC
Anion gap: 7 mmol/L (ref 5–15)
BUN/Creatinine ratio: 31 — ABNORMAL HIGH (ref 12–20)
BUN: 18 MG/DL (ref 6–20)
CO2: 22 mmol/L (ref 21–32)
Calcium: 8.2 MG/DL — ABNORMAL LOW (ref 8.5–10.1)
Chloride: 116 mmol/L — ABNORMAL HIGH (ref 97–108)
Creatinine: 0.59 MG/DL (ref 0.55–1.02)
GFR est AA: >60 mL/min/{1.73_m2} (ref >60)
GFR est non-AA: >60 mL/min/{1.73_m2} (ref >60)
Glucose: 77 mg/dL (ref 65–100)
Potassium: 4.2 mmol/L (ref 3.5–5.1)
Sodium: 145 mmol/L (ref 136–145)

## 2017-03-23 LAB — GLUCOSE, POC
Glucose (POC): 77 mg/dL (ref 65–100)
Glucose (POC): 87 mg/dL (ref 65–100)
Glucose (POC): 108 mg/dL — ABNORMAL HIGH (ref 65–100)
Glucose (POC): 110 mg/dL — ABNORMAL HIGH (ref 65–100)

## 2017-03-23 MED ORDER — AMLODIPINE 2.5 MG TAB
2.5 mg | Freq: Every day | ORAL | Status: DC
Start: 2017-03-23 — End: 2017-03-24
  Administered 2017-03-23 – 2017-03-24 (×2): via ORAL

## 2017-03-23 MED ORDER — HYDRALAZINE 20 MG/ML IJ SOLN
20 mg/mL | Freq: Four times a day (QID) | INTRAMUSCULAR | Status: DC | PRN
Start: 2017-03-23 — End: 2017-03-24
  Administered 2017-03-23 – 2017-03-24 (×3): via INTRAVENOUS

## 2017-03-23 MED ORDER — TRAMADOL 50 MG TAB
50 mg | Freq: Four times a day (QID) | ORAL | Status: DC | PRN
Start: 2017-03-23 — End: 2017-03-24
  Administered 2017-03-23 (×2): via ORAL

## 2017-03-23 MED ORDER — POLYETHYLENE GLYCOL 3350 17 GRAM (100 %) ORAL POWDER PACKET
17 gram | Freq: Every day | ORAL | Status: DC
Start: 2017-03-23 — End: 2017-03-24
  Administered 2017-03-23: 13:00:00 via ORAL

## 2017-03-23 MED ORDER — BISACODYL 5 MG TAB, DELAYED RELEASE
5 mg | ORAL | Status: AC
Start: 2017-03-23 — End: 2017-03-23
  Administered 2017-03-23: 13:00:00 via ORAL

## 2017-03-23 MED FILL — MAPAP (ACETAMINOPHEN) 325 MG TABLET: 325 mg | ORAL | Qty: 2

## 2017-03-23 MED FILL — BD POSIFLUSH NORMAL SALINE 0.9 % INJECTION SYRINGE: INTRAMUSCULAR | Qty: 10

## 2017-03-23 MED FILL — LEVOTHYROXINE 25 MCG TAB: 25 mcg | ORAL | Qty: 1

## 2017-03-23 MED FILL — BISACODYL 5 MG TAB, DELAYED RELEASE: 5 mg | ORAL | Qty: 2

## 2017-03-23 MED FILL — TRAMADOL 50 MG TAB: 50 mg | ORAL | Qty: 1

## 2017-03-23 MED FILL — HYDRALAZINE 20 MG/ML IJ SOLN: 20 mg/mL | INTRAMUSCULAR | Qty: 1

## 2017-03-23 MED FILL — BD POSIFLUSH NORMAL SALINE 0.9 % INJECTION SYRINGE: INTRAMUSCULAR | Qty: 20

## 2017-03-23 MED FILL — CEFTRIAXONE 1 GRAM SOLUTION FOR INJECTION: 1 gram | INTRAMUSCULAR | Qty: 1

## 2017-03-23 MED FILL — MEMANTINE 5 MG TAB: 5 mg | ORAL | Qty: 2

## 2017-03-23 MED FILL — HEALTHYLAX 17 GRAM ORAL POWDER PACKET: 17 gram | ORAL | Qty: 1

## 2017-03-23 MED FILL — DOK 100 MG CAPSULE: 100 mg | ORAL | Qty: 1

## 2017-03-23 MED FILL — AMLODIPINE 2.5 MG TAB: 2.5 mg | ORAL | Qty: 1

## 2017-03-23 MED FILL — MIRTAZAPINE 15 MG TAB: 15 mg | ORAL | Qty: 1

## 2017-03-23 NOTE — Progress Notes (Addendum)
PCU SHIFT NURSING NOTE      Bedside and Verbal shift change report given to Beatrix Fetters, RN (oncoming nurse) by Ellouise Newer, RN (offgoing nurse). Report included the following information SBAR, ED Summary, Intake/Output and MAR.    Shift Summary:   0730 Received report and assumed care of patient. Alert and oriented to self. Blood pressure elevated. Patient also appears to be in pain using visual pain assessment. Lungs bilaterally clear and diminished. Room air.   0900 Asked Dr. Glee Arvin for something stronger than tylenol for patient's pain. Suggested that good pain management might help bring down blood pressure. BP systolic greater than 160 but patient not due for another dose of hydralazine.  MD ordered Ultram.  0915 Rate adjusted on fluids from 100 ml/hr to 50 ml/hr.  1000 Ultram helped reduce patient's pain which is helping her rest better. Patient's blood pressure also comes down when using ultram.   1800 Hydralazine given for blood pressure with systolic at 161.   1830 When daughter, Meriam Sprague, left she asked that we call her if the patient is transferred back to [facility] health tomorrow. I said I would include a note on the chart and pass it on in shift report.    1930 Bedside and Verbal shift change report given to Lafonda Mosses, Charity fundraiser (oncoming nurse) by Marchelle Folks, RN (offgoing nurse). Report included the following information SBAR, Kardex, Intake/Output, MAR and Recent Results.             Admission Date 03/21/2017   Admission Diagnosis Elevated troponin   Consults IP CONSULT TO CARDIOLOGY  IP CONSULT TO CARDIOLOGY        Consults   [x] PT   [x] OT   [] Speech   [x] Case Management      []  Palliative      Cardiac Monitoring Order   [x] Yes   [] No     IV drips   [x] Yes    Drip:   NS                         Dose: 50 ml/hr  Drip:                            Dose:  Drip:                            Dose:   [] No     GI Prophylaxis   [] Yes   [x] No         DVT Prophylaxis   SCDs:  Sequential Compression Device: Bilateral           Ted stockings:         []  Medication   [] Contraindicated   [] None      Activity Level Activity Level: Bed Rest     Activity Assistance: Other (comment) (Needs mobility evaluation)   Purposeful Rounding every 1-2 hour?   [x] Yes   Schmidt Score  Total Score: 4   Bed Alarm (If score 3 or >)   [x] Yes   []  Refused (See signed refusal form in chart)   Braden Score  Braden Score: 14   Braden Score (if score 14 or less)   [] PMT consult   [] Wound Care consult      [] Specialty bed   []  Nutrition consult          Needs prior to discharge:   Home O2 required:    []   Yes   [x] No    If yes, how much O2 required?    Other:    Last Bowel Movement: Last Bowel Movement Date:  (Patient not able to verbalize)      Influenza Vaccine Received Flu Vaccine for Current Season (usually Sept-March): Not Flu Season        Pneumonia Vaccine           Diet Active Orders   Diet    DIET CARDIAC Mechanical Soft; Bland      LDAs               Peripheral IV 03/23/17 Left Antecubital (Active)   Site Assessment Clean, dry, & intact 03/23/2017  8:01 AM   Phlebitis Assessment 0 03/23/2017  8:01 AM   Infiltration Assessment 0 03/23/2017  8:01 AM   Dressing Status Clean, dry, & intact 03/23/2017  8:01 AM   Dressing Type Transparent 03/23/2017  8:01 AM   Hub Color/Line Status Blue;Flushed;Capped 03/23/2017  8:01 AM   Action Taken Open ports on tubing capped 03/23/2017  8:01 AM   Alcohol Cap Used Yes 03/23/2017  8:01 AM          External Female Catheter 03/22/17 (Active)   Site Assessment Clean, dry, & intact 03/23/2017  8:01 AM   Repositioned Yes 03/23/2017  8:01 AM   Perineal Care Yes 03/23/2017  8:01 AM   Wick Changed No 03/23/2017  8:01 AM   Suction Canister/Tubing Changed No 03/23/2017  8:01 AM   Urine Output (mL) 900 ml 03/23/2017  6:34 AM                Urinary Catheter      Intake & Output   Date 03/22/17 0700 - 03/23/17 0659 03/23/17 0700 - 03/24/17 0659   Shift 0700-1859 1900-0659 24 Hour Total 0700-1859 1900-0659 24 Hour Total   I  N  T  A  K  E    P.O. 360  360 120  120      P.O. 360  360 120  120    I.V.  (mL/kg/hr) 640  (1.3)  640  (0.6) 400  400      Volume (0.9% sodium chloride infusion) 640  640 400  400    Shift Total  (mL/kg) 1000  (23.6)  1000  (23.6) 520  (12.3)  520  (12.3)   O  U  T  P  U  T   Urine  (mL/kg/hr) 250  (0.5) 1075  (2.1) 1325  (1.3)         Urine Output (mL) (External Female Catheter 03/22/17) 250 1075 1325       Shift Total  (mL/kg) 250  (5.9) 1075  (25.3) 1325  (31.2)      NET 750 -1075 -325 520  520   Weight (kg) 42.4 42.4 42.4 42.4 42.4 42.4         Readmission Risk Assessment Tool Score Low Risk            12       Total Score        2 Married. Living with Significant Other. Assisted Living. LTAC. SNF. or   Rehab    5 Pt. Coverage (Medicare=5 , Medicaid, or Self-Pay=4)    5 Charlson Comorbidity Score (Age + Comorbid Conditions)        Criteria that do not apply:    Has Seen PCP in Last 6 Months (Yes=3, No=0)    Patient Length of  Stay (>5 days = 3)    IP Visits Last 12 Months (1-3=4, 4=9, >4=11)       Expected Length of Stay - - -   Actual Length of Stay 0

## 2017-03-23 NOTE — Progress Notes (Signed)
0440 hrs    BP 181/92 ; notified Dr. Bailey MechJoanne Lapetina about pt's BP and received orders for hydralazine IV for BP greater than 160/90 q 6 hrs prn; will monitor pt's condition.

## 2017-03-23 NOTE — Progress Notes (Signed)
Progress Note      03/23/2017 10:20 AM  NAME: Michele Osborne   MRN:  [id number]   Admit Diagnosis: Elevated troponin      Problem List:     1. Indeterminate troponin  2. Echo w/ EF 75%, mild MR/TR  3. Falls w/ laceration  4. UTI  5. Hypertension  6. Remote CVA w/ remote cariotomy  7. Hypothyroidism  8. Dementia, nonverbal  9. Cardiologist:  Dr. Eugenie Filler     Assessment/Plan:   EKG:  SR w/ APCs, LVH w/ repol    1. Continue norvasc 2.5mg ; uptitrate as needed  2. No additional recs today  3. Will sign off  4. Call with questions         []        High complexity decision making was performed in this patient at high risk for decompensation with multiple organ involvement.    Subjective:     Michele Osborne denies chest pain, dyspnea.  Discussed with RN events overnight.     Review of Systems:    Symptom Y/N Comments  Symptom Y/N Comments   Fever/Chills N   Chest Pain N    Poor Appetite N   Edema N    Cough N   Abdominal Pain N    Sputum N   Joint Pain N    SOB/DOE N   Pruritis/Rash N    Nausea/vomit N   Tolerating PT/OT Y    Diarrhea N   Tolerating Diet Y    Constipation N   Other       Could NOT obtain due to:      Objective:      Physical Exam:    Last 24hrs VS reviewed since prior progress note. Most recent are:    Visit Vitals   ??? BP 167/77 (BP 1 Location: Left arm, BP Patient Position: At rest)   ??? Pulse 83   ??? Temp 97.4 ??F (36.3 ??C)   ??? Resp 26   ??? Wt 42.4 kg (93 lb 8 oz)   ??? SpO2 100%   ??? Breastfeeding No       Intake/Output Summary (Last 24 hours) at 03/23/17 1020  Last data filed at 03/23/17 0634   Gross per 24 hour   Intake           641.67 ml   Output             1325 ml   Net          -683.33 ml        General Appearance: Well developed, well nourished, alert & oriented x 3,    no acute distress.  Ears/Nose/Mouth/Throat: Hearing grossly normal.  Neck: Supple.  Chest: Lungs clear to auscultation bilaterally.  Cardiovascular: Regular rate and rhythm, S1S2 normal, no murmur.   Abdomen: Soft, non-tender, bowel sounds are active.  Extremities: No edema bilaterally.  Skin: Warm and dry.    []          Post-cath site without hematoma, bruit, tenderness, or thrill.  Distal pulses intact.    PMH/SH reviewed - no change compared to H&P    Data Review    Telemetry: sinus rhythm, APCs     EKG:   [x]   No new EKG for review    Lab Data Personally Reviewed:    Recent Labs      03/23/17   0401  03/21/17   2253   WBC  3.0*  3.8   HGB  9.6*  10.2*   HCT  31.2*  31.6*   PLT  103*  109*     No results for input(s): INR, PTP, APTT in the last 72 hours.    No lab exists for component: INREXT   Recent Labs      03/23/17   0401  03/21/17   2253   NA  145  146*   K  4.2  3.5   CL  116*  114*   CO2  22  24   BUN  18  28*   CREA  0.59  1.06*   GLU  77  102*   CA  8.2*  9.5     Recent Labs      03/22/17   0154  03/21/17   2253   TROIQ  0.26*  0.26*     No results found for: CHOL, CHOLX, CHLST, CHOLV, HDL, LDL, LDLC, DLDLP, TGLX, TRIGL, TRIGP, CHHD, CHHDX    Recent Labs      03/21/17   2253   SGOT  18   AP  95   TP  9.4*   ALB  2.9*   GLOB  6.5*     No results for input(s): PH, PCO2, PO2 in the last 72 hours.    Medications Personally Reviewed:    Current Facility-Administered Medications   Medication Dose Route Frequency   ??? hydrALAZINE (APRESOLINE) 20 mg/mL injection 10 mg  10 mg IntraVENous Q6H PRN   ??? traMADol (ULTRAM) tablet 50 mg  50 mg Oral Q6H PRN   ??? polyethylene glycol (MIRALAX) packet 17 g  17 g Oral DAILY   ??? amLODIPine (NORVASC) tablet 2.5 mg  2.5 mg Oral DAILY   ??? sodium chloride (NS) flush 5-10 mL  5-10 mL IntraVENous Q8H   ??? sodium chloride (NS) flush 5-10 mL  5-10 mL IntraVENous PRN   ??? 0.9% sodium chloride infusion  50 mL/hr IntraVENous CONTINUOUS   ??? acetaminophen (TYLENOL) tablet 650 mg  650 mg Oral Q6H   ??? ondansetron (ZOFRAN) injection 4 mg  4 mg IntraVENous Q6H PRN   ??? cefTRIAXone (ROCEPHIN) 1 g in 0.9% sodium chloride (MBP/ADV) 50 mL  1 g IntraVENous Q24H    ??? docusate sodium (COLACE) capsule 100 mg  100 mg Oral DAILY   ??? levothyroxine (SYNTHROID) tablet 25 mcg  25 mcg Oral ACB   ??? memantine (NAMENDA) tablet 10 mg  10 mg Oral BID   ??? mirtazapine (REMERON) tablet 15 mg  15 mg Oral QHS         Michele SchillingGeorge H Mueller III, DO

## 2017-03-23 NOTE — Progress Notes (Addendum)
Hospitalist Progress Note    NAME: Michele Osborne   DOB:  01-Oct-1930   MRN:  [id number]       Assessment / Plan:  S/p fall coming out of bathroom and hitting side of head against sink: has laceration that is sutured on left temporal area, CT head is negative for acute pathology but has areas of encephalomalacia and old CVA's that had left patient with dementia  Left temporal laceration, POA sutured in ER wound irrigated and sutured with 6 sutures of 6-0 nylon by ER.  Will need to be removed in 5 to 7 days.  Left shoulder pain s/p fall: XR negative, c/w pain medication.  UTI: c/w CTX, F/U cultures.  Elevated trop 0.26: most likely 2ry to fall, Cardiology help appreciated.  HTN: start low dose Norvasc, use hydralazine prn  Hx stroke/Hx craniotomy by CT patient not on aspirin.  ?did she have prior hemorrhagic stroke or SDH?  Hypothyroid continue levothyroxine  Dementia  continue remeron and namenda  Surrogate decision maker:  Daughter Myrle Sheng [phone]  Code status: Full  Prophylaxis: SCD's  Recommended Disposition: SNF/LTC     Plan to D/c back to [facility] tomorrow if stable     Subjective:     Chief Complaint / Reason for Physician Visit  "My shoulder hurts".  Discussed with RN events overnight.     Review of Systems:  Symptom Y/N Comments  Symptom Y/N Comments   Fever/Chills    Chest Pain     Poor Appetite    Edema     Cough    Abdominal Pain     Sputum    Joint Pain y    SOB/DOE    Pruritis/Rash     Nausea/vomit    Tolerating PT/OT     Diarrhea    Tolerating Diet y    Constipation    Other       Could NOT obtain due to:      Objective:     VITALS:   Last 24hrs VS reviewed since prior progress note. Most recent are:  Patient Vitals for the past 24 hrs:   Temp Pulse Resp BP SpO2   03/23/17 0801 97.4 ??F (36.3 ??C) 83 26 167/77 100 %   03/23/17 0500 - 65 16 161/78 100 %   03/23/17 0444 - 66 24 175/86 100 %   03/23/17 0403 97.6 ??F (36.4 ??C) 65 24 (!) 181/92 100 %    03/23/17 0000 98 ??F (36.7 ??C) 70 12 132/79 100 %   03/22/17 2308 - 77 18 (!) 198/99 98 %   03/22/17 2303 98 ??F (36.7 ??C) - - - -   03/22/17 1928 97.6 ??F (36.4 ??C) 76 20 148/74 100 %   03/22/17 1733 - 88 22 (!) 167/104 100 %   03/22/17 1731 - 80 22 (!) 166/94 100 %   03/22/17 1727 - 76 26 148/64 100 %   03/22/17 1439 97.4 ??F (36.3 ??C) 77 16 128/75 100 %   03/22/17 1129 - 71 18 118/63 100 %   03/22/17 0940 97.4 ??F (36.3 ??C) 77 16 140/79 100 %   03/22/17 0900 98.5 ??F (36.9 ??C) 76 15 158/79 100 %       Intake/Output Summary (Last 24 hours) at 03/23/17 0840  Last data filed at 03/23/17 0634   Gross per 24 hour   Intake              760 ml   Output  1325 ml   Net             -565 ml        PHYSICAL EXAM:  General: WD, WN. Alert, cooperative, confused, no acute distress????  EENT:  EOMI. Anicteric sclerae. MMM  Resp:  Coarse BS  CV:  Regular  rhythm,?? No edema  GI:  Soft, Non distended, Non tender. ??+Bowel sounds  Neurologic:?? Alert and oriented X 2, normal speech,   Psych:???? Poor  insight.??Not anxious nor agitated  Skin:  No rashes.  No jaundice    Reviewed most current lab test results and cultures  YES  Reviewed most current radiology test results   YES  Review and summation of old records today    NO  Reviewed patient's current orders and MAR    YES  PMH/SH reviewed - no change compared to H&P  ________________________________________________________________________  Care Plan discussed with:    Comments   Patient y    Family  y    Charity fundraiserN y    AcupuncturistCare Manager     Consultant                        Multidiciplinary team rounds were held today with case manager, nursing, pharmacist and Higher education careers adviserclinical coordinator.  Patient's plan of care was discussed; medications were reviewed and discharge planning was addressed.     ________________________________________________________________________  Total NON critical care TIME: 35   Minutes    Total CRITICAL CARE TIME Spent:   Minutes non procedure based      Comments    >50% of visit spent in counseling and coordination of care y    ________________________________________________________________________  Hazeline Junkerarlos J Mencias, MD     Procedures: see electronic medical records for all procedures/Xrays and details which were not copied into this note but were reviewed prior to creation of Plan.      LABS:  I reviewed today's most current labs and imaging studies.  Pertinent labs include:  Recent Labs      03/23/17   0401  03/21/17   2253   WBC  3.0*  3.8   HGB  9.6*  10.2*   HCT  31.2*  31.6*   PLT  103*  109*     Recent Labs      03/23/17   0401  03/21/17   2253   NA  145  146*   K  4.2  3.5   CL  116*  114*   CO2  22  24   GLU  77  102*   BUN  18  28*   CREA  0.59  1.06*   CA  8.2*  9.5   ALB   --   2.9*   TBILI   --   0.2   SGOT   --   18   ALT   --   11*       Signed: Hazeline Junkerarlos J Mencias, MD

## 2017-03-23 NOTE — Progress Notes (Signed)
PCU SHIFT NURSING NOTE      Bedside and Verbal shift change report given to Clarisse GougeBridget (Cabin crewoncoming nurse) by Lafonda Mossesiana (offgoing nurse). Report included the following information SBAR, Kardex, MAR, Recent Results and Med Rec Status.    Shift Summary:       Admission Date 03/21/2017   Admission Diagnosis Elevated troponin   Consults IP CONSULT TO CARDIOLOGY  IP CONSULT TO CARDIOLOGY        Consults   [] PT   [] OT   [] Speech   [] Case Management      []  Palliative      Cardiac Monitoring Order   [x] Yes   [] No     IV drips   [] Yes    Drip:                            Dose:  Drip:                            Dose:  Drip:                            Dose:   [x] No     GI Prophylaxis   [] Yes   [] No         DVT Prophylaxis   SCDs:  Sequential Compression Device: Bilateral          Ted stockings:         []  Medication   [] Contraindicated   [] None      Activity Level Activity Level: Bed Rest     Activity Assistance: Partial (two people)   Purposeful Rounding every 1-2 hour?   [x] Yes   Noreene FilbertSchmidt Score  Total Score: 4   Bed Alarm (If score 3 or >)   [] Yes   []  Refused (See signed refusal form in chart)   Braden Score  Braden Score: 14   Braden Score (if score 14 or less)   [] PMT consult   [] Wound Care consult      [] Specialty bed   []  Nutrition consult          Needs prior to discharge:   Home O2 required:    [] Yes   [] No    If yes, how much O2 required?    Other:    Last Bowel Movement: Last Bowel Movement Date:  (Prior to admission; patient unable to verbalize)      Influenza Vaccine Received Flu Vaccine for Current Season (usually Sept-March): Not Flu Season        Pneumonia Vaccine           Diet Active Orders   Diet    DIET CARDIAC Mechanical Soft; Bland      LDAs               Peripheral IV 03/23/17 Left Antecubital (Active)   Site Assessment Clean, dry, & intact 03/23/2017  7:40 PM   Phlebitis Assessment 0 03/23/2017  7:40 PM   Infiltration Assessment 0 03/23/2017  7:40 PM   Dressing Status Clean, dry, & intact 03/23/2017  7:40 PM    Dressing Type Transparent 03/23/2017  7:40 PM   Hub Color/Line Status Blue 03/23/2017  7:40 PM   Action Taken Open ports on tubing capped 03/23/2017  7:40 PM   Alcohol Cap Used Yes 03/23/2017  8:01 AM          External Female  Catheter 03/22/17 (Active)   Site Assessment Clean, dry, & intact 03/23/2017  7:40 PM   Repositioned Yes 03/23/2017  8:01 AM   Perineal Care Yes 03/23/2017  8:01 AM   Wick Changed No 03/23/2017  8:01 AM   Suction Canister/Tubing Changed No 03/23/2017  8:01 AM   Urine Output (mL) 500 ml 03/24/2017  6:45 AM                Urinary Catheter      Intake & Output   Date 03/23/17 0700 - 03/24/17 0659 03/24/17 0700 - 03/25/17 0659   Shift 0700-1859 1900-0659 24 Hour Total 0700-1859 1900-0659 24 Hour Total   I  N  T  A  K  E   P.O. 120  120         P.O. 120  120       I.V.  (mL/kg/hr) 400  (0.8)  400  (0.4)         Volume (0.9% sodium chloride infusion) 400  400       Shift Total  (mL/kg) 520  (12.3)  520  (12.3)      O  U  T  P  U  T   Urine  (mL/kg/hr)  500  (1) 500  (0.5)         Urine Output (mL) (External Female Catheter 03/22/17)  500 500       Shift Total  (mL/kg)  500  (11.8) 500  (11.8)      NET 520 -500 20      Weight (kg) 42.4 42.4 42.4 42.4 42.4 42.4         Readmission Risk Assessment Tool Score Low Risk            12       Total Score        2 Married. Living with Significant Other. Assisted Living. LTAC. SNF. or   Rehab    5 Pt. Coverage (Medicare=5 , Medicaid, or Self-Pay=4)    5 Charlson Comorbidity Score (Age + Comorbid Conditions)        Criteria that do not apply:    Has Seen PCP in Last 6 Months (Yes=3, No=0)    Patient Length of Stay (>5 days = 3)    IP Visits Last 12 Months (1-3=4, 4=9, >4=11)       Expected Length of Stay - - -   Actual Length of Stay 0

## 2017-03-24 LAB — GLUCOSE, POC
Glucose (POC): 95 mg/dL (ref 65–100)
Glucose (POC): 100 mg/dL (ref 65–100)
Glucose (POC): 73 mg/dL (ref 65–100)
Glucose (POC): 84 mg/dL (ref 65–100)

## 2017-03-24 LAB — METABOLIC PANEL, COMPREHENSIVE
A-G Ratio: 0.4 — ABNORMAL LOW (ref 1.1–2.2)
ALT (SGPT): 9 U/L — ABNORMAL LOW (ref 12–78)
AST (SGOT): 16 U/L (ref 15–37)
Albumin: 2.2 g/dL — ABNORMAL LOW (ref 3.5–5.0)
Alk. phosphatase: 75 U/L (ref 45–117)
Anion gap: 7 mmol/L (ref 5–15)
BUN/Creatinine ratio: 21 — ABNORMAL HIGH (ref 12–20)
BUN: 18 MG/DL (ref 6–20)
Bilirubin, total: 0.1 MG/DL — ABNORMAL LOW (ref 0.2–1.0)
CO2: 23 mmol/L (ref 21–32)
Calcium: 8.1 MG/DL — ABNORMAL LOW (ref 8.5–10.1)
Chloride: 117 mmol/L — ABNORMAL HIGH (ref 97–108)
Creatinine: 0.86 MG/DL (ref 0.55–1.02)
GFR est AA: >60 mL/min/{1.73_m2} (ref >60)
GFR est non-AA: >60 mL/min/{1.73_m2} (ref >60)
Globulin: 5.2 g/dL — ABNORMAL HIGH (ref 2.0–4.0)
Glucose: 79 mg/dL (ref 65–100)
Potassium: 3.5 mmol/L (ref 3.5–5.1)
Protein, total: 7.4 g/dL (ref 6.4–8.2)
Sodium: 147 mmol/L — ABNORMAL HIGH (ref 136–145)

## 2017-03-24 LAB — CBC WITH AUTOMATED DIFF
ABS. BASOPHILS: 0 10*3/uL (ref 0.0–0.1)
ABS. EOSINOPHILS: 0.1 10*3/uL (ref 0.0–0.4)
ABS. IMM. GRANS.: 0 10*3/uL (ref 0.00–0.04)
ABS. LYMPHOCYTES: 1.3 10*3/uL (ref 0.8–3.5)
ABS. MONOCYTES: 0.4 10*3/uL (ref 0.0–1.0)
ABS. NEUTROPHILS: 1.8 10*3/uL (ref 1.8–8.0)
ABSOLUTE NRBC: 0 10*3/uL (ref 0.00–0.01)
BASOPHILS: 1 % (ref 0–1)
EOSINOPHILS: 1 % (ref 0–7)
HCT: 29.7 % — ABNORMAL LOW (ref 35.0–47.0)
HGB: 9.7 g/dL — ABNORMAL LOW (ref 11.5–16.0)
IMMATURE GRANULOCYTES: 0 % (ref 0.0–0.5)
LYMPHOCYTES: 35 % (ref 12–49)
MCH: 27.9 PG (ref 26.0–34.0)
MCHC: 32.7 g/dL (ref 30.0–36.5)
MCV: 85.3 FL (ref 80.0–99.0)
MONOCYTES: 12 % (ref 5–13)
MPV: 11.1 FL (ref 8.9–12.9)
NEUTROPHILS: 51 % (ref 32–75)
NRBC: 0 PER 100 WBC
PLATELET: 104 10*3/uL — ABNORMAL LOW (ref 150–400)
RBC: 3.48 M/uL — ABNORMAL LOW (ref 3.80–5.20)
RDW: 16.8 % — ABNORMAL HIGH (ref 11.5–14.5)
WBC: 3.6 10*3/uL (ref 3.6–11.0)

## 2017-03-24 LAB — MAGNESIUM: Magnesium: 1.6 mg/dL (ref 1.6–2.4)

## 2017-03-24 MED ORDER — CEFUROXIME AXETIL 500 MG TAB
500 mg | ORAL_TABLET | Freq: Two times a day (BID) | ORAL | 0 refills | Status: AC
Start: 2017-03-24 — End: 2017-03-31

## 2017-03-24 MED ORDER — CIPROFLOXACIN 0.3 % EYE DROPS
0.3 % | Freq: Four times a day (QID) | OPHTHALMIC | 0 refills | Status: AC
Start: 2017-03-24 — End: ?

## 2017-03-24 MED ORDER — AMLODIPINE 2.5 MG TAB
2.5 mg | ORAL_TABLET | Freq: Every day | ORAL | 1 refills | Status: AC
Start: 2017-03-24 — End: ?

## 2017-03-24 MED ORDER — POLYETHYLENE GLYCOL 3350 17 GRAM (100 %) ORAL POWDER PACKET
17 gram | Freq: Every day | ORAL | 1 refills | Status: AC
Start: 2017-03-24 — End: ?

## 2017-03-24 MED ORDER — TRAMADOL 50 MG TAB
50 mg | ORAL_TABLET | Freq: Three times a day (TID) | ORAL | 0 refills | Status: AC | PRN
Start: 2017-03-24 — End: ?

## 2017-03-24 MED FILL — MAPAP (ACETAMINOPHEN) 325 MG TABLET: 325 mg | ORAL | Qty: 2

## 2017-03-24 MED FILL — MEMANTINE 5 MG TAB: 5 mg | ORAL | Qty: 2

## 2017-03-24 MED FILL — CEFTRIAXONE 1 GRAM SOLUTION FOR INJECTION: 1 gram | INTRAMUSCULAR | Qty: 1

## 2017-03-24 MED FILL — AMLODIPINE 2.5 MG TAB: 2.5 mg | ORAL | Qty: 1

## 2017-03-24 MED FILL — MIRTAZAPINE 15 MG TAB: 15 mg | ORAL | Qty: 1

## 2017-03-24 MED FILL — HEALTHYLAX 17 GRAM ORAL POWDER PACKET: 17 gram | ORAL | Qty: 1

## 2017-03-24 MED FILL — HYDRALAZINE 20 MG/ML IJ SOLN: 20 mg/mL | INTRAMUSCULAR | Qty: 1

## 2017-03-24 MED FILL — LEVOTHYROXINE 25 MCG TAB: 25 mcg | ORAL | Qty: 1

## 2017-03-24 MED FILL — BD POSIFLUSH NORMAL SALINE 0.9 % INJECTION SYRINGE: INTRAMUSCULAR | Qty: 10

## 2017-03-24 MED FILL — DOK 100 MG CAPSULE: 100 mg | ORAL | Qty: 1

## 2017-03-24 NOTE — Progress Notes (Signed)
Occupational Therapy    Completed chart review and discussed patient with nursing. Patient is a LTC resident at [facility].  Had fall in room at LTC.  Plans to dc back to [facility] today.  No acute care OT needs.  Recommend potential OT eval at SNF to ensure safety with ADL tasks secondary to fall happening in bathroom at LTC.     Thank you,    Harlow Ohmsanielle Corrigan OTR/L

## 2017-03-24 NOTE — Progress Notes (Signed)
Reason for Admission:   Pt was admitted on 03/22/17 d/t a Dx of s/p fall coming out of the bathroom and hitting side of head against sink at [employer].  Dementia                  RRAT Score:                  Do you (patient/family) have any concerns for transition/discharge?     No concerns.  Family requested to return to [facility] when stable.               Plan for utilizing home health:     N/A.  Lives at LTC    Likelihood of readmission?   MOD            Transition of Care Plan:      Pt lives at [place]: [facility].  Pt will need transportation back to LTC.      CM called DTR number and left a message with family member for her today that she was being discharged back to [facility].     RN please call [facility] and schedule transportation.  [phone].  CM placed PCS packet on bedside chart for [facility].     RN please call report to [facility] [phone].  Pt lives in room 17.   RN please fax DC summary and scripts to [phone]    No further CM needs.        Care Management Interventions  PCP Verified by CM: Yes  Palliative Care Criteria Met (RRAT>21 & CHF Dx)?: No  Mode of Transport at Discharge: BLS  Transition of Care Consult (CM Consult): [place]: No  Discharge Durable Medical Equipment: No  Physical Therapy Consult: No  Occupational Therapy Consult: No  Speech Therapy Consult: No  Current Support Network: Nursing Facility  Confirm Follow Up Transport: Other (see comment)  Plan discussed with Pt/Family/Caregiver: Yes  Freedom of Choice Offered: Yes  Discharge Location  Discharge Placement: [place], [place]   Ext [phone]

## 2017-03-24 NOTE — Discharge Summary (Signed)
Hospitalist Discharge Summary     Patient ID:  Michele Osborne  [id number]  83 y.o.  10-23-1929    PCP on record: Imogene Burn, MD    Admit date: 03/21/2017  Discharge date and time: 03/24/2017      DISCHARGE DIAGNOSIS:    S/P Fall, Left Temporal Laceration, UTI, Increased Troponin, HTN, Hypothyroidism, Dementia, H/O CVA      CONSULTATIONS:  IP CONSULT TO CARDIOLOGY  IP CONSULT TO CARDIOLOGY    Excerpted HPI from H&P of Norton Pastel, MD:  Michele Osborne is a 83 y.o.  African American female brought in by EMS from [facility] nursing home.  No family at bedside.  Patient oriented to self only and unable to provide history.  According to EMS report, she fell while coming from bathroom, had lost balance.  Struck side of sink during fell and her glasses appear to have cut her.  EMS noted patient was on floor and bleeding from laceration controlled by nursing staff.  EMS cleared C-spine.  In ER, found to have elevated troponin 0.26 and ER physician spoke to cardiology who recommended admission for observation.  Also diagnosed as uti and given rocephin.  Per ER physician "Family denies any mental status changes. Family states tetanus is up to date. Family denies any recent fever, chills, cough or congestion."  We were asked to admit for work up and evaluation of the above problems.     ______________________________________________________________________  DISCHARGE SUMMARY/HOSPITAL COURSE:  for full details see H&P, daily progress notes, labs, consult notes.   S/p fall coming out of bathroom and hitting side of head against sink: has laceration that is sutured on left temporal area, CT head is negative for acute pathology but has areas of encephalomalacia and old CVA's that had left patient with dementia  Left temporal laceration, POA sutured in ER wound irrigated and sutured with 6 sutures of 6-0 nylon by ER. ??Will need to be removed in  7 days.   Left shoulder pain s/p fall: XR negative, c/w pain medication.  UTI: c/w CTX, F/U cultures. So far GNR, will D/c on Ceftin  Elevated trop 0.26: most likely 2ry to fall, Cardiology help appreciated.  HTN: c/w  low dose Norvasc, use hydralazine prn  Hx stroke/Hx craniotomy by CT patient not on aspirin. ???did she have prior hemorrhagic stroke or SDH?  Hypothyroid continue levothyroxine  Dementia  continue remeron and namenda  Surrogate decision maker: ??Daughter Michele Osborne [phone]  Code status: Full  Prophylaxis: SCD's  Recommended Disposition: SNF/LTC   ??  D/c to SNF back to [facility], F/U with PCP and Cardiology  _______________________________________________________________________  Patient seen and examined by me on discharge day.  Pertinent Findings:  Gen:    Not in distress  Chest: Coarse BS  CVS:   Regular rhythm.  No edema  Abd:  Soft, not distended, not tender  Neuro:  Alert, GCS [id number]  _______________________________________________________________________  DISCHARGE MEDICATIONS:   Current Discharge Medication List      START taking these medications    Details   amLODIPine (NORVASC) 2.5 mg tablet Take 1 Tab by mouth daily.  Qty: 30 Tab, Refills: 1      polyethylene glycol (MIRALAX) 17 gram packet Take 1 Packet by mouth daily.  Qty: 30 Each, Refills: 1      traMADol (ULTRAM) 50 mg tablet Take 1 Tab by mouth every eight (8) hours as needed. Max Daily Amount: 150 mg.  Qty: 30 Tab, Refills: 0  Associated Diagnoses: Fall at nursing home, initial encounter; Facial laceration, initial encounter      ciprofloxacin HCl (CILOXAN) 0.3 % ophthalmic solution Administer 1 Drop to both eyes every six (6) hours.  Qty: 5 mL, Refills: 0      cefUROXime (CEFTIN) 500 mg tablet Take 1 Tab by mouth two (2) times a day for 7 days.  Qty: 14 Tab, Refills: 0         CONTINUE these medications which have NOT CHANGED    Details   mirtazapine (REMERON SOL-TAB) 15 mg disintegrating tablet Take 15 mg by mouth nightly.       memantine ER (NAMENDA XR) 28 mg capsule Take 28 mg by mouth daily.      levothyroxine (SYNTHROID) 25 mcg tablet Take 25 mcg by mouth Daily (before breakfast).         STOP taking these medications       docusate sodium (COLACE) 100 mg capsule Comments:   Reason for Stopping:         cranberry extract 450 mg tab tablet Comments:   Reason for Stopping:               My Recommended Diet, Activity, Wound Care, and follow-up labs are listed in the patient's Discharge Insturctions which I have personally completed and reviewed.    ______________________________________________________________________    Risk of deterioration: Moderate    Condition at Discharge:  Stable  ______________________________________________________________________    Disposition  SNF/LTC  ______________________________________________________________________    Care Plan discussed with:   Patient, Family, RN, Care Manager, Consultant    ______________________________________________________________________    Code Status: Full Code  ______________________________________________________________________      Follow up with:   PCP : Imogene Burnahir Khan, MD  Follow-up Information     Follow up With Details Comments Contact Info    Imogene Burnahir Khan, MD   [id number]  [address]  [place] [id number]  [phone]          F/U PCP  F/U Cardiology      Total time in minutes spent coordinating this discharge (includes going over instructions, follow-up, prescriptions, and preparing report for sign off to her PCP) :  35 minutes    Signed:  Hazeline Junkerarlos J Mencias, MD

## 2017-03-24 NOTE — Progress Notes (Signed)
Hospital to SNF SBAR Handoff - Michele MacleodBarbara Agnes                                                                        83 y.o.   female    [facility]   Room: 2251/01    MRM 2 PROGRESSIVE CARE  Unit Phone# :  [phone]        [facility]  MRM 2 PROGRESSIVE CARE  [address]  [place] [id number]  Dept: [phone]  Loc: [phone]                    SITUATION        BACKGROUND     Allergies:  Allergies   Allergen Reactions   ??? Sulfa (Sulfonamide Antibiotics) Unknown (comments)       Past Medical History:   Diagnosis Date   ??? Dementia    ??? Endocrine disease     hypothyroid   ??? Hypertension    ??? Stroke [facility](HCC) 1989       Past Surgical History:   Procedure Laterality Date   ??? HX GYN      fibroids       Prescriptions Prior to Admission   Medication Sig   ??? mirtazapine (REMERON SOL-TAB) 15 mg disintegrating tablet Take 15 mg by mouth nightly.   ??? memantine ER (NAMENDA XR) 28 mg capsule Take 28 mg by mouth daily.   ??? levothyroxine (SYNTHROID) 25 mcg tablet Take 25 mcg by mouth Daily (before breakfast).   ??? docusate sodium (COLACE) 100 mg capsule Take 100 mg by mouth daily.   ??? cranberry extract 450 mg tab tablet Take 425 mg by mouth daily.       Hard scripts included in transfer packet yes    Vaccinations:    There is no immunization history on file for this patient.    Readmission Risks:    Known Risks:         The Charlson CoMorbitiy Index tool is an evidenced based tool that has more automatic generated information. The tool looks at many different items such as the age of the patient, how many times they were admitted in the last calendar year, current length of stay in the hospital and their diagnosis. All of these items are pulled automatically from information documented in the chart from various places and will generate a score that predicts whether a patient is at low (less than 13), medium (13-20) or high (21 or greater) risk of being readmitted.        ASSESSMENT                 Temp: 97.9 ??F (36.6 ??C) (03/24/17 1218) Pulse (Heart Rate): (!) 101 (03/24/17 1218)     Resp Rate: 24 (03/24/17 1218)           BP: 143/67 (03/24/17 1218)     O2 Sat (%): 100 % (03/24/17 1218)     Weight: 42.4 kg (93 lb 8 oz)    Height: (not recorded)       If above not within 1 hour of discharge:    BP:_____  P:____  R:____ T:_____ O2 Sat: ___%  O2: ______    Active Orders   Diet    DIET CARDIAC Mechanical Soft; Bland         Orientation: disoriented and only aware of  person     Active Behaviors: None                                   Active Lines/Drains:  (Peg Tube / Foley / CL or S/L?): no    Urinary Status: External catheter     Last BM: Last Bowel Movement Date:  (Prior to admission; patient unable to verbalize)     Skin Integrity: Laceration (comment)             Mobility: Very limited   Weight Bearing Status: WBAT (Weight Bearing as Tolerated)                Lab Results   Component Value Date/Time    Glucose 79 03/24/2017 04:29 AM    HGB 9.7 (L) 03/24/2017 04:29 AM    HGB 9.6 (L) 03/23/2017 04:01 AM        RECOMMENDATION     See After Visit Summary (AVS) for:  ?? Discharge instructions  ?? After Hospital Care Plan   ?? Special equipment needed (entered pre-discharge by Care Management)  ?? Medication Reconciliation    ?? Follow up Appointment(s)         Report given/sent by:  Tia Masker, RN                    Verbal report given to: [facility]         Estimated discharge time:  03/24/2017 at1200\\

## 2017-03-24 NOTE — Discharge Summary (Signed)
Discharge Summary by Hazeline JunkerMencias, Nasra Counce J, MD at 03/24/17 [phone]                Author: Hazeline JunkerMencias, Judah Carchi J, MD  Service: Internal Medicine  Author Type: Physician       Filed: 03/24/17 0856  Date of Service: 03/24/17 0853  Status: Signed          Editor: Hazeline JunkerMencias, Gerik Coberly J, MD (Physician)                                       Hospitalist Discharge Summary        Patient ID:   Michele Osborne   [id number]   83 y.o.   01/15/1930      PCP on record: Imogene Burnahir Khan, MD      Admit date: 03/21/2017   Discharge date and time: 03/24/2017         DISCHARGE DIAGNOSIS:      S/P Fall, Left Temporal Laceration, UTI, Increased Troponin, HTN, Hypothyroidism, Dementia, H/O CVA         CONSULTATIONS:   IP CONSULT TO CARDIOLOGY   IP CONSULT TO CARDIOLOGY      Excerpted HPI from H&P of Norton PastelKari H Nguyen, MD:   Michele MacleodBarbara Osborne is a 83 y.o.  African American female brought in by EMS from [facility] nursing home.  No family at bedside.  Patient oriented to self only  and unable to provide history.   According to EMS report, she fell while coming from bathroom, had lost balance.  Struck side of sink during fell and her glasses appear to have cut her.  EMS noted patient was on floor and bleeding from laceration controlled by nursing staff.  EMS  cleared C-spine.   In ER, found to have elevated troponin 0.26 and ER physician spoke to cardiology who recommended admission for observation.  Also diagnosed as uti and given rocephin.  Per ER physician "Family denies any mental status changes. Family states tetanus  is up to date. Family denies any recent fever, chills, cough or congestion."   We were asked to admit for work up and evaluation of the above problems.       ______________________________________________________________________   DISCHARGE SUMMARY/HOSPITAL COURSE:   for full details see H&P, daily progress notes, labs, consult notes.    S/p fall coming out of bathroom and hitting side of head against sink: has laceration that is  sutured on left temporal  area, CT head is negative for acute pathology but has areas of encephalomalacia and old CVA's that had left patient with dementia   Left temporal laceration, POA sutured in ER wound irrigated and sutured with 6 sutures of 6-0 nylon by ER. ??Will need to be removed in   7 days.   Left shoulder pain s/p fall: XR negative, c/w pain medication.   UTI: c/w CTX, F/U cultures. So far GNR, will D/c on Ceftin   Elevated trop 0.26: most likely 2ry to fall, Cardiology help appreciated.   HTN: c/w  low dose Norvasc, use hydralazine prn   Hx stroke/Hx craniotomy by CT  patient not on aspirin. ???did she have prior hemorrhagic stroke or SDH?   Hypothyroid continue levothyroxine   Dementia  continue remeron and namenda   Surrogate decision maker: ??Daughter Michele SpragueBeverly Minor [phone]   Code status: Full   Prophylaxis: SCD's   Recommended Disposition: SNF/LTC     ??  D/c to SNF back to [facility], F/U with PCP and Cardiology   _______________________________________________________________________   Patient seen and examined by me on discharge day.   Pertinent Findings:   Gen:    Not in distress   Chest: Coarse BS   CVS:   Regular rhythm.  No edema   Abd:  Soft, not distended, not tender   Neuro:  Alert, GCS [id number]   _______________________________________________________________________   DISCHARGE MEDICATIONS:      Current Discharge Medication List              START taking these medications          Details        amLODIPine (NORVASC) 2.5 mg tablet  Take 1 Tab by mouth daily.   Qty: 30 Tab, Refills:  1               polyethylene glycol (MIRALAX) 17 gram packet  Take 1 Packet by mouth daily.   Qty: 30 Each, Refills:  1               traMADol (ULTRAM) 50 mg tablet  Take 1 Tab by mouth every eight (8) hours as needed. Max Daily Amount: 150 mg.   Qty: 30 Tab, Refills:  0          Associated Diagnoses: Fall at nursing home, initial encounter; Facial laceration, initial encounter               ciprofloxacin HCl  (CILOXAN) 0.3 % ophthalmic solution  Administer 1 Drop to both eyes every six (6) hours.   Qty: 5 mL, Refills:  0               cefUROXime (CEFTIN) 500 mg tablet  Take 1 Tab by mouth two (2) times a day for 7 days.   Qty: 14 Tab, Refills:  0                     CONTINUE these medications which have NOT CHANGED          Details        mirtazapine (REMERON SOL-TAB) 15 mg disintegrating tablet  Take 15 mg by mouth nightly.               memantine ER (NAMENDA XR) 28 mg capsule  Take 28 mg by mouth daily.               levothyroxine (SYNTHROID) 25 mcg tablet  Take 25 mcg by mouth Daily (before breakfast).                     STOP taking these medications                  docusate sodium (COLACE) 100 mg capsule  Comments:    Reason for Stopping:                      cranberry extract 450 mg tab tablet  Comments:    Reason for Stopping:                             My Recommended Diet, Activity, Wound Care, and follow-up labs are listed in the patient's Discharge Insturctions which I have personally completed and reviewed.      ______________________________________________________________________      Risk of deterioration:  Moderate      Condition at Discharge:  Stable   ______________________________________________________________________      Disposition   SNF/LTC   ______________________________________________________________________      Care Plan discussed with:    Patient, Family, RN, Care Manager, Consultant      ______________________________________________________________________      Code Status: Full Code   ______________________________________________________________________         Follow up with:    PCP : Imogene Burn, MD     Follow-up Information        Follow up With  Details  Comments  Contact Info             Imogene Burn, MD      [address]   [address]   [place] [address]   [phone]                F/U PCP   F/U Cardiology         Total time in minutes spent coordinating this discharge  (includes going over instructions, follow-up, prescriptions, and preparing report for sign off to her PCP) :  35 minutes      Signed:   Hazeline Junker, MD

## 2017-03-25 LAB — CULTURE, URINE
Colonies Counted: 50000
Colony Count: 50000

## 2018-01-06 DEATH — deceased
